# Patient Record
Sex: Male | Born: 1960 | Race: White | Hispanic: No | Marital: Single | Smoking: Current some day smoker
Health system: Southern US, Community
[De-identification: ages and names within clinical notes are randomized; demographics above are authoritative.]

## PROBLEM LIST (undated history)

## (undated) DIAGNOSIS — J45909 Unspecified asthma, uncomplicated: Secondary | ICD-10-CM

## (undated) DIAGNOSIS — Z8739 Personal history of other diseases of the musculoskeletal system and connective tissue: Secondary | ICD-10-CM

## (undated) DIAGNOSIS — F1120 Opioid dependence, uncomplicated: Secondary | ICD-10-CM

## (undated) DIAGNOSIS — F209 Schizophrenia, unspecified: Secondary | ICD-10-CM

## (undated) DIAGNOSIS — F329 Major depressive disorder, single episode, unspecified: Secondary | ICD-10-CM

## (undated) DIAGNOSIS — F419 Anxiety disorder, unspecified: Secondary | ICD-10-CM

## (undated) DIAGNOSIS — M1990 Unspecified osteoarthritis, unspecified site: Secondary | ICD-10-CM

## (undated) DIAGNOSIS — J449 Chronic obstructive pulmonary disease, unspecified: Secondary | ICD-10-CM

## (undated) DIAGNOSIS — G8929 Other chronic pain: Secondary | ICD-10-CM

## (undated) DIAGNOSIS — J42 Unspecified chronic bronchitis: Secondary | ICD-10-CM

## (undated) DIAGNOSIS — F32A Depression, unspecified: Secondary | ICD-10-CM

## (undated) HISTORY — DX: Opioid dependence, uncomplicated: F11.20

## (undated) HISTORY — DX: Unspecified chronic bronchitis: J42

## (undated) HISTORY — DX: Anxiety disorder, unspecified: F41.9

## (undated) HISTORY — DX: Depression, unspecified: F32.A

## (undated) HISTORY — PX: BACK SURGERY: SHX140

## (undated) HISTORY — DX: Unspecified osteoarthritis, unspecified site: M19.90

## (undated) HISTORY — DX: Major depressive disorder, single episode, unspecified: F32.9

## (undated) HISTORY — DX: Unspecified asthma, uncomplicated: J45.909

## (undated) HISTORY — PX: OTHER SURGICAL HISTORY: SHX169

## (undated) HISTORY — DX: Other chronic pain: G89.29

## (undated) HISTORY — PX: SKIN GRAFT: SHX250

---
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
## 2008-05-30 ENCOUNTER — Emergency Department (HOSPITAL_COMMUNITY): Admission: EM | Admit: 2008-05-30 | Discharge: 2008-05-30 | Payer: Self-pay | Admitting: Emergency Medicine

## 2010-08-26 ENCOUNTER — Emergency Department (HOSPITAL_COMMUNITY)
Admission: EM | Admit: 2010-08-26 | Discharge: 2010-08-27 | Disposition: A | Discharge status: Home / Self Care | Payer: No Typology Code available for payment source | Attending: Emergency Medicine | Admitting: Emergency Medicine

## 2010-08-26 DIAGNOSIS — S335XXA Sprain of ligaments of lumbar spine, initial encounter: Secondary | ICD-10-CM | POA: Insufficient documentation

## 2010-08-26 DIAGNOSIS — M545 Low back pain, unspecified: Secondary | ICD-10-CM | POA: Insufficient documentation

## 2010-08-26 DIAGNOSIS — W19XXXA Unspecified fall, initial encounter: Secondary | ICD-10-CM | POA: Insufficient documentation

## 2010-08-27 ENCOUNTER — Emergency Department (HOSPITAL_COMMUNITY): Payer: Self-pay

## 2011-09-28 DIAGNOSIS — E778 Other disorders of glycoprotein metabolism: Secondary | ICD-10-CM | POA: Insufficient documentation

## 2011-09-28 DIAGNOSIS — R739 Hyperglycemia, unspecified: Secondary | ICD-10-CM | POA: Insufficient documentation

## 2011-09-29 DIAGNOSIS — R449 Unspecified symptoms and signs involving general sensations and perceptions: Secondary | ICD-10-CM | POA: Insufficient documentation

## 2012-08-19 DIAGNOSIS — M5412 Radiculopathy, cervical region: Secondary | ICD-10-CM | POA: Insufficient documentation

## 2012-08-19 DIAGNOSIS — G43009 Migraine without aura, not intractable, without status migrainosus: Secondary | ICD-10-CM | POA: Insufficient documentation

## 2014-06-21 DIAGNOSIS — F323 Major depressive disorder, single episode, severe with psychotic features: Secondary | ICD-10-CM | POA: Insufficient documentation

## 2014-06-22 DIAGNOSIS — F209 Schizophrenia, unspecified: Secondary | ICD-10-CM | POA: Insufficient documentation

## 2014-11-01 ENCOUNTER — Other Ambulatory Visit: Payer: Self-pay | Admitting: Orthopedic Surgery

## 2014-11-01 DIAGNOSIS — M25511 Pain in right shoulder: Secondary | ICD-10-CM

## 2014-11-23 ENCOUNTER — Other Ambulatory Visit: Payer: Self-pay

## 2014-11-23 ENCOUNTER — Inpatient Hospital Stay: Admission: RE | Admit: 2014-11-23 | Payer: Self-pay | Source: Ambulatory Visit

## 2016-05-28 DIAGNOSIS — K219 Gastro-esophageal reflux disease without esophagitis: Secondary | ICD-10-CM | POA: Diagnosis not present

## 2016-05-28 DIAGNOSIS — R001 Bradycardia, unspecified: Secondary | ICD-10-CM | POA: Diagnosis not present

## 2016-05-28 DIAGNOSIS — R55 Syncope and collapse: Secondary | ICD-10-CM | POA: Diagnosis not present

## 2016-05-28 DIAGNOSIS — R531 Weakness: Secondary | ICD-10-CM | POA: Diagnosis not present

## 2016-05-28 DIAGNOSIS — R079 Chest pain, unspecified: Secondary | ICD-10-CM | POA: Diagnosis not present

## 2016-05-29 DIAGNOSIS — R079 Chest pain, unspecified: Secondary | ICD-10-CM | POA: Diagnosis not present

## 2016-05-29 DIAGNOSIS — K219 Gastro-esophageal reflux disease without esophagitis: Secondary | ICD-10-CM | POA: Diagnosis not present

## 2016-05-29 DIAGNOSIS — R531 Weakness: Secondary | ICD-10-CM | POA: Diagnosis not present

## 2016-05-29 DIAGNOSIS — R001 Bradycardia, unspecified: Secondary | ICD-10-CM | POA: Diagnosis not present

## 2016-12-12 ENCOUNTER — Encounter: Payer: Self-pay | Admitting: Internal Medicine

## 2016-12-15 ENCOUNTER — Ambulatory Visit (INDEPENDENT_AMBULATORY_CARE_PROVIDER_SITE_OTHER)
Admission: RE | Admit: 2016-12-15 | Discharge: 2016-12-15 | Disposition: A | Discharge status: Home / Self Care | Payer: Medicaid Other | Source: Ambulatory Visit | Attending: Internal Medicine | Admitting: Internal Medicine

## 2016-12-15 ENCOUNTER — Encounter: Payer: Self-pay | Admitting: Internal Medicine

## 2016-12-15 ENCOUNTER — Ambulatory Visit (INDEPENDENT_AMBULATORY_CARE_PROVIDER_SITE_OTHER): Payer: Medicaid Other | Admitting: Internal Medicine

## 2016-12-15 VITALS — BP 132/78 | HR 61 | Ht 74.0 in | Wt 230.0 lb

## 2016-12-15 DIAGNOSIS — R0609 Other forms of dyspnea: Secondary | ICD-10-CM

## 2016-12-15 DIAGNOSIS — F17210 Nicotine dependence, cigarettes, uncomplicated: Secondary | ICD-10-CM

## 2016-12-15 DIAGNOSIS — J449 Chronic obstructive pulmonary disease, unspecified: Secondary | ICD-10-CM | POA: Diagnosis not present

## 2016-12-15 MED ORDER — BUDESONIDE-FORMOTEROL FUMARATE 160-4.5 MCG/ACT IN AERO: 2.0000 | INHALATION_SPRAY | Freq: Two times a day (BID) | RESPIRATORY_TRACT | 11 refills | Status: DC

## 2016-12-15 MED ORDER — BUDESONIDE-FORMOTEROL FUMARATE 160-4.5 MCG/ACT IN AERO
2.0000 | INHALATION_SPRAY | Freq: Two times a day (BID) | RESPIRATORY_TRACT | 0 refills | Status: DC
Start: 2016-12-15 — End: 2016-12-15

## 2016-12-15 MED ORDER — FAMOTIDINE 20 MG PO TABS: ORAL_TABLET | ORAL | 11 refills | Status: DC

## 2016-12-15 MED ORDER — PANTOPRAZOLE SODIUM 40 MG PO TBEC: 40.0000 mg | DELAYED_RELEASE_TABLET | Freq: Every day | ORAL | 2 refills | Status: DC

## 2016-12-15 NOTE — Progress Notes (Signed)
LMTCB

## 2016-12-15 NOTE — Patient Instructions (Addendum)
Plan A = Automatic = symbicort 160 Take 2 puffs first thing in am and then another 2 puffs about 12 hours later.    Work on inhaler technique:  relax and gently blow all the way out then take a nice smooth deep breath back in, triggering the inhaler at same time you start breathing in.  Hold for up to 5 seconds if you can. Blow out thru nose. Rinse and gargle with water when done   Plan B = Backup Only use your albuterol as a rescue medication to be used if you can't catch your breath by resting or doing a relaxed purse lip breathing pattern.  - The less you use it, the better it will work when you need it. - Ok to use the inhaler up to 2 puffs  every 4 hours if you must but call for appointment if use goes up over your usual need - Don't leave home without it !!  (think of it like the spare tire for your car)    Pantoprazole (protonix) 40 mg   Take  30-60 min before first meal of the day and Pepcid (famotidine)  20 mg one @  bedtime until return to office - this is the best way to tell whether stomach acid is contributing to your problem.    GERD (REFLUX)  is an extremely common cause of respiratory symptoms just like yours , many times with no obvious heartburn at all.    It can be treated with medication, but also with lifestyle changes including elevation of the head of your bed (ideally with 6 inch  bed blocks),  Smoking cessation, avoidance of late meals, excessive alcohol, and avoid fatty foods, chocolate, peppermint, colas, red wine, and acidic juices such as orange juice.  NO MINT OR MENTHOL PRODUCTS SO NO COUGH DROPS   USE SUGARLESS CANDY INSTEAD (Jolley ranchers or Stover's or Life Savers) or even ice chips will also do - the key is to swallow to prevent all throat clearing. NO OIL BASED VITAMINS - use powdered substitutes.   The key is to stop smoking completely before smoking completely stops you!   Although I don't endorse regular use of e cigs/ many patients find them helpful;  however, I emphasized they should be considered a "one-way bridge" off all tobacco products.   Please remember to go to the  x-ray department downstairs in the basement  for your tests - we will call you with the results when they are available.      Please schedule a follow up office visit in 6 weeks, call sooner if needed

## 2016-12-15 NOTE — Progress Notes (Signed)
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 Pulm Consult    Pt referred by Dr. Heide Guile for mixed restrictive and obstructive lung disease. Pt is having trouble with SOB while sittting and with exertion for last month. Pt having tightness in chest, having some dry cough throughtout the day.   using saba multiple times during the day  including 2.5 h  prior to OV  And waking up at noct at least once  Wakes up nl hour / gasping for air but s excess/ purulent sputum or mucus plugs   Doe x "anything" >> wife gets the mail and he's become extremely sedentary due to doe and back pain  No obvious day to day or daytime variability or assoc excess/ purulent sputum or mucus plugs or hemoptysis or cp or chest tightness, subjective wheeze or overt sinus or hb symptoms. No unusual exp hx or h/o childhood pna/ asthma or knowledge of premature birth.  Sleeping ok without nocturnal  or early am exacerbation  of respiratory  c/o's or need for noct saba. Also denies any obvious fluctuation of symptoms with weather or environmental changes or other aggravating or alleviating factors except as outlined above   Current Medications, Allergies, Complete Past Medical History, Past Surgical History, Family History, and Social History were reviewed in [employer] record.  ROS  The following are not  active complaints unless bolded sore throat, dysphagia, dental problems, itching, sneezing,  nasal congestion or excess/ purulent secretions, ear ache,   fever, chills, sweats, unintended wt loss, classically pleuritic or exertional cp,  orthopnea pnd or leg swelling, presyncope, palpitations, abdominal pain, anorexia, nausea, vomiting, diarrhea  or change in bowel or bladder habits, change in stools or urine, dysuria,hematuria,  rash, arthralgias, visual complaints, headache, numbness, weakness or ataxia or problems with walking or coordination,  change in mood/affect or memory.             Review of Systems     Objective:   Physical Exam    amb disheveled wm nad >> stated age    Wt Readings from Last 3 Encounters:  12/15/16 230 lb (104.3 kg)    Vital signs reviewed  - Note on arrival 02 sats  98% on RA      HEENT: nl  turbinates bilaterally, and oropharynx. Nl external ear canals without cough reflex - edentulous    NECK :  without JVD/Nodes/TM/ nl carotid upstrokes bilaterally   LUNGS: no acc muscle use,   slt barrel chest with prominent pseudowheeze better with plm    CV:  RRR  no s3 or murmur or increase in P2, and no edema   ABD:  soft and nontender with nl inspiratory excursion in the supine position. No bruits or organomegaly appreciated, bowel  sounds nl  MS:  Nl gait/ ext warm without deformities, calf tenderness, cyanosis or clubbing No obvious joint restrictions   SKIN: warm and dry without lesions    NEURO:  alert, approp, nl sensorium with  no motor or cerebellar deficits apparent.    CXR PA and Lateral:   12/15/2016 :    I personally reviewed images and agree with radiology impression as follows:    Chronic bronchitic changes without acute abnormalities.    Assessment:

## 2016-12-16 ENCOUNTER — Telehealth: Payer: Self-pay | Admitting: Internal Medicine

## 2016-12-16 DIAGNOSIS — F17210 Nicotine dependence, cigarettes, uncomplicated: Secondary | ICD-10-CM | POA: Insufficient documentation

## 2016-12-16 NOTE — Progress Notes (Signed)
LMTCB

## 2016-12-16 NOTE — Assessment & Plan Note (Addendum)
12/15/2016  Walked RA x 3 laps @ 185 ft each stopped due to  End of study, nl pace, no  desat - mild sob at end    Based on this study symptoms are not as bad as he described and do fit COPD GOLD 0 and nothing to suggest alternative dx at this point but clearly able to walk to [facility] to get the mail - strongly encouraged regular walking so we can monitor his response to rx

## 2016-12-16 NOTE — Assessment & Plan Note (Addendum)
PFT's  10/23/2016  FEV1 3.30 (79 % ) ratio 74  p no % improvement from saba p saba prior to study with DLCO  60 % corrects to 119  % for alv volume   - 12/15/2016  After extensive coaching HFA effectiveness =  50% > try symbicort 160 2bid    In this case Adherence is the biggest issue and starts with  inability to use HFA appropriately (way too much at baseline) / effectively and also  understand that SABA treats the symptoms but doesn't get to the underlying problem (inflammation).  I used  the analogy of putting steroid cream on a rash to help explain the meaning of topical therapy and the need to get the drug to the target tissue.   - see hfa teaching - return with all meds in hand using a trust but verify approach to confirm accurate Medication  Reconciliation The principal here is that until we are certain that the  patients are doing what we've asked, it makes no sense to ask them to do more.    Active smoking > see sep a/p   ? Allergy/ asthma component > symb 160 should do   ? Anxiety/depression/ deconditioning  > usually at the bottom of this list of usual suspects but should be much higher on this pt's based on H and P and note already on psychotropics .   ? Acid (or non-acid) GERD > always difficult to exclude as up to 75% of pts in some series report no assoc GI/ Heartburn symptoms - his am "gasping for air" could be due to noct LPR  > rec max (24h)  acid suppression and diet restrictions/ reviewed and instructions given in writing.    Total time devoted to counseling  > 50 % of initial 60 min office visit:  review case with pt/wife Tresa EndoKelly  discussion of options/alternatives/ personally creating written customized instructions  in presence of pt  then going over those specific  Instructions directly with the pt including how to use all of the meds but in particular covering each new medication in detail and the difference between the maintenance= "automatic" meds and the prns using an  action plan format for the latter (If this problem/symptom => do that organization reading Left to right).  Please see AVS from this visit for a full list of these instructions which I personally wrote for this pt and  are unique to this visit.

## 2016-12-16 NOTE — Telephone Encounter (Signed)
Notes recorded by Nyoka CowdenWert, Michael B, MD on 12/15/2016 at 2:06 PM EDT Call pt: Reviewed cxr and no acute change so no change in recommendations made at [facility] ------------------------- Spoke with pt. He is aware of results. Nothing further was needed.

## 2016-12-16 NOTE — Assessment & Plan Note (Signed)

## 2016-12-18 NOTE — Progress Notes (Signed)
Spoke with the pt and he states already aware of results See 12/16/16 phone note

## 2017-01-26 ENCOUNTER — Ambulatory Visit: Payer: Medicaid Other | Admitting: Internal Medicine

## 2017-04-14 ENCOUNTER — Emergency Department (HOSPITAL_COMMUNITY): Payer: Medicaid Other

## 2017-04-14 ENCOUNTER — Encounter (HOSPITAL_COMMUNITY): Payer: Self-pay | Admitting: *Deleted

## 2017-04-14 ENCOUNTER — Emergency Department (HOSPITAL_COMMUNITY)
Admission: EM | Admit: 2017-04-14 | Discharge: 2017-04-14 | Disposition: A | Discharge status: Home / Self Care | Payer: Medicaid Other | Attending: Emergency Medicine | Admitting: Emergency Medicine

## 2017-04-14 DIAGNOSIS — W19XXXA Unspecified fall, initial encounter: Secondary | ICD-10-CM

## 2017-04-14 DIAGNOSIS — F17210 Nicotine dependence, cigarettes, uncomplicated: Secondary | ICD-10-CM | POA: Diagnosis not present

## 2017-04-14 DIAGNOSIS — R202 Paresthesia of skin: Secondary | ICD-10-CM | POA: Diagnosis not present

## 2017-04-14 DIAGNOSIS — S0990XA Unspecified injury of head, initial encounter: Secondary | ICD-10-CM | POA: Diagnosis present

## 2017-04-14 DIAGNOSIS — M25522 Pain in left elbow: Secondary | ICD-10-CM | POA: Diagnosis not present

## 2017-04-14 DIAGNOSIS — Z79899 Other long term (current) drug therapy: Secondary | ICD-10-CM | POA: Diagnosis not present

## 2017-04-14 DIAGNOSIS — M5432 Sciatica, left side: Secondary | ICD-10-CM | POA: Diagnosis not present

## 2017-04-14 DIAGNOSIS — W010XXA Fall on same level from slipping, tripping and stumbling without subsequent striking against object, initial encounter: Secondary | ICD-10-CM | POA: Diagnosis not present

## 2017-04-14 DIAGNOSIS — J45909 Unspecified asthma, uncomplicated: Secondary | ICD-10-CM | POA: Diagnosis not present

## 2017-04-14 DIAGNOSIS — Y999 Unspecified external cause status: Secondary | ICD-10-CM | POA: Diagnosis not present

## 2017-04-14 DIAGNOSIS — Y9389 Activity, other specified: Secondary | ICD-10-CM | POA: Insufficient documentation

## 2017-04-14 DIAGNOSIS — S0081XA Abrasion of other part of head, initial encounter: Secondary | ICD-10-CM | POA: Insufficient documentation

## 2017-04-14 DIAGNOSIS — S00219A Abrasion of unspecified eyelid and periocular area, initial encounter: Secondary | ICD-10-CM

## 2017-04-14 DIAGNOSIS — J449 Chronic obstructive pulmonary disease, unspecified: Secondary | ICD-10-CM | POA: Diagnosis not present

## 2017-04-14 DIAGNOSIS — Y92018 Other place in single-family (private) house as the place of occurrence of the external cause: Secondary | ICD-10-CM | POA: Insufficient documentation

## 2017-04-14 LAB — BASIC METABOLIC PANEL
ANION GAP: 9 (ref 5–15)
BUN: 21 mg/dL — ABNORMAL HIGH (ref 6–20)
CHLORIDE: 100 mmol/L — AB (ref 101–111)
CO2: 25 mmol/L (ref 22–32)
Calcium: 8.9 mg/dL (ref 8.9–10.3)
Creatinine, Ser: 1 mg/dL (ref 0.61–1.24)
GFR calc non Af Amer: >60 mL/min (ref >60)
Glucose, Bld: 84 mg/dL (ref 65–99)
Potassium: 3.6 mmol/L (ref 3.5–5.1)
Sodium: 134 mmol/L — ABNORMAL LOW (ref 135–145)

## 2017-04-14 LAB — URINALYSIS, ROUTINE W REFLEX MICROSCOPIC
Bilirubin Urine: NEGATIVE
GLUCOSE, UA: NEGATIVE mg/dL
Ketones, ur: NEGATIVE mg/dL
Leukocytes, UA: NEGATIVE
Nitrite: NEGATIVE
Protein, ur: NEGATIVE mg/dL
Specific Gravity, Urine: >1.03 — ABNORMAL HIGH (ref 1.005–1.030)
pH: 5.5 (ref 5.0–8.0)

## 2017-04-14 LAB — CBC
HCT: 44.3 % (ref 39.0–52.0)
HEMOGLOBIN: 14.9 g/dL (ref 13.0–17.0)
MCH: 30.2 pg (ref 26.0–34.0)
MCHC: 33.6 g/dL (ref 30.0–36.0)
MCV: 89.7 fL (ref 78.0–100.0)
Platelets: 234 10*3/uL (ref 150–400)
RBC: 4.94 MIL/uL (ref 4.22–5.81)
RDW: 12.7 % (ref 11.5–15.5)
WBC: 7.7 10*3/uL (ref 4.0–10.5)

## 2017-04-14 LAB — URINALYSIS, MICROSCOPIC (REFLEX)

## 2017-04-14 LAB — CBG MONITORING, ED: GLUCOSE-CAPILLARY: 102 mg/dL — AB (ref 65–99)

## 2017-04-14 MED ORDER — OXYCODONE-ACETAMINOPHEN 5-325 MG PO TABS: 2.0000 | ORAL_TABLET | Freq: Once | ORAL | Status: DC

## 2017-04-14 MED ORDER — CYCLOBENZAPRINE HCL 10 MG PO TABS
10.0000 mg | ORAL_TABLET | Freq: Once | ORAL | Status: AC
  Administered 2017-04-14: 10 mg via ORAL
  Filled 2017-04-14: qty 1

## 2017-04-14 MED ORDER — DICLOFENAC SODIUM 75 MG PO TBEC
75.0000 mg | DELAYED_RELEASE_TABLET | Freq: Two times a day (BID) | ORAL | 0 refills | Status: DC
Start: 2017-04-14 — End: 2019-08-12

## 2017-04-14 MED ORDER — DEXAMETHASONE SODIUM PHOSPHATE 10 MG/ML IJ SOLN
10.0000 mg | Freq: Once | INTRAMUSCULAR | Status: AC
  Administered 2017-04-14: 10 mg via INTRAMUSCULAR
  Filled 2017-04-14: qty 1

## 2017-04-14 MED ORDER — LIDOCAINE 5 % EX PTCH: 1.0000 | MEDICATED_PATCH | CUTANEOUS | Status: DC

## 2017-04-14 MED ORDER — LIDOCAINE 5 % EX PTCH: 1.0000 | MEDICATED_PATCH | CUTANEOUS | 0 refills | Status: DC

## 2017-04-14 MED ORDER — KETOROLAC TROMETHAMINE 30 MG/ML IJ SOLN: 30.0000 mg | Freq: Once | INTRAMUSCULAR | Status: DC

## 2017-04-14 MED ORDER — KETOROLAC TROMETHAMINE 30 MG/ML IJ SOLN
30.0000 mg | Freq: Once | INTRAMUSCULAR | Status: AC
  Administered 2017-04-14: 30 mg via INTRAMUSCULAR
  Filled 2017-04-14: qty 1

## 2017-04-14 MED ORDER — DEXAMETHASONE SODIUM PHOSPHATE 10 MG/ML IJ SOLN: 10.0000 mg | Freq: Once | INTRAMUSCULAR | Status: DC

## 2017-04-14 MED ORDER — METHOCARBAMOL 500 MG PO TABS: 500.0000 mg | ORAL_TABLET | Freq: Three times a day (TID) | ORAL | 0 refills | Status: DC | PRN

## 2017-04-14 NOTE — ED Notes (Signed)
Pt ambulatory, stable, and verbalizes understanding of d/c instructions. 

## 2017-04-14 NOTE — ED Notes (Signed)
ED Provider at bedside. 

## 2017-04-14 NOTE — ED Triage Notes (Addendum)
Per EMS- pt had a fall this morning while walking his dog and tripped. Pt not reports left elbow pain and weakness. Pt also has left leg pain with hx of sciatica. Pt also has minor laceration to left elbow. Pt states that he also woke with some dizziness this morning. Pt states that be is at his baseline for other weaknesses related to back injuries and shoulder injuries in the past.  No neuro deficits with EMS. VSS

## 2017-04-14 NOTE — ED Notes (Signed)
Pt family at the desk stating patient is having chest pain, RN went to bedside to assess and patient was sleeping, upon waking patient he stated he just didn't feel good but did not complain of pain. MD notified.

## 2017-04-14 NOTE — ED Notes (Signed)
Pt ambulatory to restroom
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
## 2017-04-14 NOTE — ED Provider Notes (Signed)
11:00 PM Patient care assumed from [facility], PA-C at change of shift. Patient reporting a mechanical fall at 9 AM while walking his dog. He is complaining of pain to his left upper extremity. 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  Patient has been sleeping for the majority of his time in the ED; appearing in no discomfort. He was previously given anti-inflammatories as well as a steroid injection. One course of pain medicine was added for pain management. I went to assess the patient and upon waking him he was noted to move all extremities spontaneously. He took his left hand and lifted it without prompting placing the palm of his hand behind his neck to stretch. When I attempted to passively move his left upper extremity, his movement was very limited as he was resisting my exam with 5/5 strength in all major muscle groups of the left arm.  A cervical spine CT was obtained which does not show any evidence of acute traumatic injury. Given full range of motion with normal strength, I do not believe further emergent workup or imaging is indicated. Patient to be discharged with a course of NSAIDs as well as Lidoderm patches. He has been instructed to follow-up with his orthopedist to which she verbalizes understanding. Return precautions discussed and provided. Patient discharged in stable condition with no unaddressed concerns.   Results for orders placed or performed during the hospital encounter of 04/14/17  Basic metabolic panel  Result Value Ref Range   Sodium 134 (L) 135 - 145 mmol/L   Potassium 3.6 3.5 - 5.1 mmol/L   Chloride 100 (L) 101 - 111 mmol/L   CO2 25 22 - 32 mmol/L   Glucose, Bld 84 65 - 99 mg/dL   BUN 21 (H) 6 - 20 mg/dL   Creatinine, Ser 1.61 0.61 - 1.24 mg/dL   Calcium 8.9 8.9 - 09.6 mg/dL   GFR calc non Af Amer >60 >60 mL/min   GFR calc Af Amer >60 >60 mL/min   Anion gap 9 5 - 15  CBC  Result Value  Ref Range   WBC 7.7 4.0 - 10.5 K/uL   RBC 4.94 4.22 - 5.81 MIL/uL   Hemoglobin 14.9 13.0 - 17.0 g/dL   HCT 04.5 40.9 - 81.1 %   MCV 89.7 78.0 - 100.0 fL   MCH 30.2 26.0 - 34.0 pg   MCHC 33.6 30.0 - 36.0 g/dL   RDW 91.4 78.2 - 95.6 %   Platelets 234 150 - 400 K/uL  Urinalysis, Routine w reflex microscopic  Result Value Ref Range   Color, Urine YELLOW YELLOW   APPearance HAZY (A) CLEAR   Specific Gravity, Urine >1.030 (H) 1.005 - 1.030   pH 5.5 5.0 - 8.0   Glucose, UA NEGATIVE NEGATIVE mg/dL   Hgb urine dipstick TRACE (A) NEGATIVE   Bilirubin Urine NEGATIVE NEGATIVE   Ketones, ur NEGATIVE NEGATIVE mg/dL   Protein, ur NEGATIVE NEGATIVE mg/dL   Nitrite NEGATIVE NEGATIVE   Leukocytes, UA NEGATIVE NEGATIVE  Urinalysis, Microscopic (reflex)  Result Value Ref Range   RBC / HPF 0-5 0 - 5 RBC/hpf   WBC, UA 0-5 0 - 5 WBC/hpf   Bacteria, UA FEW (A) NONE SEEN   Squamous Epithelial / LPF 0-5 (A) NONE SEEN   Mucus PRESENT    Hyaline Casts, UA PRESENT    Sperm, UA PRESENT    Urine-Other LESS THAN 10  mL OF URINE SUBMITTED   CBG monitoring, ED  Result Value Ref Range   Glucose-Capillary 102 (H) 65 - 99 mg/dL   Dg Elbow Complete Left  Result Date: 04/14/2017 CLINICAL DATA:  Larey SeatFell today.  Medial elbow pain. EXAM: LEFT ELBOW - COMPLETE 3+ VIEW COMPARISON:  None. FINDINGS: There is no evidence of fracture, dislocation, or joint effusion. There is no evidence of arthropathy or other focal bone abnormality. Soft tissues are unremarkable. IMPRESSION: Negative. Electronically Signed   By: Paulina FusiMark  Shogry M.D.   On: 04/14/2017 13:16   Ct Head Wo Contrast  Result Date: 04/14/2017 CLINICAL DATA:  Dizziness. Fell this morning. LEFT side is not working, according to the patient. EXAM: CT HEAD WITHOUT CONTRAST TECHNIQUE: Contiguous axial images were obtained from the base of the skull through the vertex without intravenous contrast. COMPARISON:  MRI brain 05/29/2016. FINDINGS: Brain: No evidence for acute  infarction, hemorrhage, mass lesion, hydrocephalus, or extra-axial fluid. Normal for age cerebral volume. Scattered areas of hypoattenuation throughout the white matter, probably unchanged in comparison with prior MR, nonspecific. Vascular: No hyperdense vessel or unexpected calcification. Skull: Normal. Negative for fracture or focal lesion. Sinuses/Orbits: Chronic maxillary and ethmoid sinus disease, without definite layering fluid. Other: Chronic anterior subluxation or dislocation of the LEFT TMJ. IMPRESSION: No acute intracranial findings are evident. Scattered white matter hypodensities, nonspecific, probably unchanged from prior MR. Sinusitis, without layering fluid. Electronically Signed   By: Elsie StainJohn T Curnes M.D.   On: 04/14/2017 13:39   Ct Cervical Spine Wo Contrast  Result Date: 04/14/2017 CLINICAL DATA:  Larey SeatFell while walking dog today.  Dizziness. EXAM: CT CERVICAL SPINE WITHOUT CONTRAST TECHNIQUE: Multidetector CT imaging of the cervical spine was performed without intravenous contrast. Multiplanar CT image reconstructions were also generated. COMPARISON:  MRI 05/29/2016 FINDINGS: Alignment: 3 mm degenerative anterolisthesis C4-5. Straightening of the normal cervical lordosis. Skull base and vertebrae: No fracture or traumatic finding. Soft tissues and spinal canal: No evidence of soft tissue injury or mass. Disc levels:  C1-2: Ordinary osteoarthritis.  No stenosis. C2-3: Normal interspace. C3-4: Mild uncovertebral prominence. Facet degeneration right more than left. No central canal stenosis. Mild bilateral bony foraminal narrowing. C4-5: Advanced degenerative facet arthropathy on the left. Anterolisthesis of 3 mm. Bulging of the disc. No central canal stenosis. Left foraminal narrowing that could affect the C5 nerve. C5-6: Chronic spondylosis with disc space narrowing. Mild bilateral bony foraminal narrowing. C6-7: Chronic spondylosis with disc space narrowing. Mild bilateral bony foraminal narrowing.  C6-7: Chronic spondylosis with disc space narrowing. Mild bilateral bony foraminal narrowing. Upper chest: Negative Other: None IMPRESSION: No acute or traumatic finding. Degenerative abnormalities in the cervical spine as outlined above. No compressive central canal stenosis. Foraminal narrowing as above. Electronically Signed   By: Paulina FusiMark  Shogry M.D.   On: 04/14/2017 20:20     Antony MaduraHumes, [place]adey Baz[place], PA-C 04/14/17 2319    Alvira MondaySchlossman, Erin, MD 04/15/17 [phone]

## 2019-08-01 ENCOUNTER — Ambulatory Visit: Payer: Self-pay | Admitting: Orthopedic Surgery

## 2019-08-23 ENCOUNTER — Ambulatory Visit: Payer: Self-pay | Admitting: Orthopedic Surgery

## 2019-08-23 NOTE — H&P (Signed)
Subjective:   Calvin Hartman is a pleasant 59 year old male with PMH significant for substance use disorder (on methadone), and tobacco use disorder who presents with chronic neck pain that has been worsening for the last year and has become severe and debilitating especially with range of motion over the last week. He denies any significant injury. He states due to the pain he has difficulty extending his neck and notes he has been looking down at the ground. Additionally he has increased pain when he has to extend his neck to put in his dentures. He has numbness and pain into his bilateral shoulders right greater than left. He also notes audible popping clicking with range of motion of his neck. Denies any normal arm weakness. Patient states he has taken some over-the-counter anti-inflammatories but these have not been significantly helpful. He has had a MRI and he would like to move forward with surgical intervention. He is scheduled for ACDF C5-7 08/31/19 at [facility] with Dr. Rolena Infante.  Patient Active Problem List   Diagnosis Date Noted  . Cigarette smoker 12/16/2016  . Dyspnea on exertion 12/15/2016  . COPD GOLD 0 / still smoking 12/15/2016  . Schizophrenia ([place]) 06/22/2014  . Major depressive disorder with psychotic features ([place]) 06/21/2014  . Migraine without aura 08/19/2012  . Brachial neuritis 08/19/2012  . Sensory deficit, left 09/29/2011  . Hypoproteinemia ([place]) 09/28/2011  . Hyperglycemia 09/28/2011  . Hypocalcemia 09/28/2011   Past Medical History:  Diagnosis Date  . Anxiety and depression   . Arthritis   . Asthma   . Bronchitis, chronic ([place])   . Chronic pain   . Opiate addiction [facility])     Past Surgical History:  Procedure Laterality Date  . NOSE SURGERY  1978    Current Outpatient Medications  Medication Sig Dispense Refill Last Dose  . albuterol (VENTOLIN HFA) 108 (90 Base) MCG/ACT inhaler Inhale 1-2 puffs into the lungs every 6 (six) hours as needed for wheezing or shortness of  breath.     . Aspirin-Acetaminophen-Caffeine (GOODY HEADACHE PO) Take 1 Package by mouth 2 (two) times daily as needed (pain).      . cephALEXin (KEFLEX) 500 MG capsule Take 500 mg by mouth 2 (two) times daily.     . methadone (DOLOPHINE) 10 MG/ML solution Take 103 mg by mouth daily.     Marland Kitchen oxyCODONE-acetaminophen (PERCOCET) 10-325 MG tablet Take 1 tablet by mouth 3 (three) times daily as needed for pain.     Marland Kitchen oxymetazoline (AFRIN) 0.05 % nasal spray Place 1 spray into both nostrils 2 (two) times daily as needed for congestion.      . rosuvastatin (CRESTOR) 5 MG tablet Take 5 mg by mouth every other day. At bedtime      No current facility-administered medications for this visit.   Allergies  Allergen Reactions  . Hydrocodone Nausea And Vomiting and Rash    Social History   Tobacco Use  . Smoking status: Current Some Day Smoker    Packs/day: 1.00    Years: 40.00    Pack years: 40.00  . Smokeless tobacco: Never Used  Substance Use Topics  . Alcohol use: No    Family History  Problem Relation Age of Onset  . Uterine cancer Mother   . Heart disease Father   . Heart attack Father   . Bone cancer Father   . Leukemia Father     Review of Systems As stated in HPI  Objective:   Vitals: Ht: 6 ft 1 in  08/22/2019 03:23 pm Wt: 216 lbs 08/22/2019 03:27 pm BMI: 28.5 08/22/2019 03:27 pm BP: 148/90 08/22/2019 03:30 pm Pulse: 83 bpm 08/22/2019 03:30 pm T: 97.8 F 08/22/2019 03:30 pm Pain Scale: 6 08/22/2019 03:27 pm  Clinical exam: Windell returns today for follow-up. He continues to have severe neck and radicular right arm pain. He is AAOx3, NAD.  Heart: RRR, no rubs, murmers, or gallops  Lungs: CTAB  Abdomen: BSx4, Non-tender; no repound tenderness, non-distended, no loss of bladder or bowel control.  Neurological exam: Positive right Spurling sign with reproduction of numbness and dysesthesias primarily in the C7 and C6 dermatome. Significant neck pain with palpation and  range of motion radiating into the right upper extremity. 5/5 motor strength bilaterally in the upper extremity except for some trace weakness of his right wrist extensor and tricep. Negative Hoffman test, no clonus, normal gait pattern.  No significant shoulder, elbow, wrist pain with isolated joint range of motion. 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 Surgical scar well-healed.  Cervical MRI: completed on 07/26/19 was reviewed with the patient. It was completed at [facility]; I have independently reviewed the images as well as the radiology report. No cord signal changes. Multilevel degenerative disease with reversal of normal cervical lordosis centered at C5-6. Advanced right facet arthrosis at C2-3 and C3-4. Moderate to severe right neuroforaminal narrowing with possible encroachment on the right C4 dorsal root ganglion. Advanced degenerative disc disease C5-T1. Mild to moderate neural foraminal narrowing right side C5-7.  Assessment:   Calvin Hartman is a pleasant 59 year old male with PMH significant for substance use disorder (on methadone), and tobacco use disorder who presents with chronic debilitating neck and neuropathic right arm pain for the last year. Prior to seeing me he was treated with formalized physical therapy and epidural cervical steroid injections. He states the injections only worsened his pain and he did not get any significant relief with physical therapy. At the time of his initial presentation he was documented having left hand weakness but he states this is secondary to the amputation of multiple fingers from a chain saw injury. He had a reimplantation and while he does have numbness in the left hand as well as weakness it is not his primary problem. He is very clear that the neck and radicular right arm pain primarily in the C6 and C7 dermatome is what is driving his loss in quality-of-life pain. He also describes occipital headaches and  numbness and headaches in the head.  Plan:   At this point time based on his imaging studies and clinical exam I believe his primary pain generator is the degenerative cervical disease at C5-6 and C6-7. While there is degenerative disease at C7-T1 he has no localizing C8 radicular pain. Although the MRI shows mild to moderate foraminal stenosis with minimal direct contact to the C6 and C7 nerve root clinically he is clearly having significant radicular pain in those dermatomes. I did indicate to him that his generalized headaches and the facial numbness is not related to the cervical spine. While his occipital headaches can be, I did inform him that the goal of surgery is to reduce not eliminate his neck and radicular arm pain and hopefully improve his quality-of-life. We have gone over the ACDF procedure in great detail and all of his questions were encouraged and addressed.  Risks and benefits of surgery were discussed with the patient. These include: Infection, bleeding, death, stroke, paralysis, ongoing or worse pain, need for additional  surgery, nonunion, leak of spinal fluid, adjacent segment degeneration requiring additional fusion surgery. Pseudoarthrosis (nonunion)requiring supplemental posterior fixation. Throat pain, swallowing difficulties, hoarseness or change in voice.  We have obtained preoperative medical clearance from the patient's primary care provider. Primary care provider informed us that the patient is on methadone chronically. I have discussed this with the patient as it will make his postoperative pain more difficult to manage. Additionally, I have personally reached out to his methadone clinic 3 times to discuss with the provider recommendations for managing his pain postoperatively, but no one has responded to return my call. At today's H&P I encouraged the patient that he should also reach out to his methadone provider to find out the best way to manage his pain postoperatively.  He did express an understanding of this.  I reviewed the patient's medication list with him. He is not on any blood thinners. Does not take any aspirin. He is not taking any over-the-counter anti-inflammatory medications and I advised him to avoid any over-the-counter anti-inflammatory medications leading up to surgery. He did express understanding of this.  We have also discussed the post-operative recovery period to include: bathing/showering restrictions, wound healing, activity (and driving) restrictions, medications/pain mangement.  We have also discussed post-operative redflags to include: signs and symptoms of postoperative infection, DVT/PE.  Patient states he has not heard from [facility] to schedule his preoperative testing. I informed him that they should be contacting him in and that it is very important he have his preoperative testing done with them.  Patient will get his Aspen collar at the hospital.  All patients questions were invited and answered  Follow-up: 2 weeks postoperatively

## 2019-08-23 NOTE — Pre-Procedure Instructions (Signed)
Your procedure is scheduled on Wednesday, Aug 31, 2019 from 07:30 AM- 11:30 AM.  Report to [facility] Main Entrance "A" at 05:30 A.M., and check in at the Admitting office.  Call this number if you have problems the morning of surgery:  [phone]  Call [phone] if you have any questions prior to your surgery date Monday-Friday 8am-4pm.    Remember:  Do not eat or drink after midnight the night before your surgery.    Take these medicines the morning of surgery with A SIP OF WATER:  IF NEEDED: oxyCODONE-acetaminophen (PERCOCET) oxymetazoline (AFRIN) nasal spray albuterol (VENTOLIN HFA) inhaler Please bring all inhalers with you the day of surgery.    As of today, STOP taking any Aspirin (unless otherwise instructed by your surgeon) and Aspirin containing products, Aleve, Naproxen, Ibuprofen, Motrin, Advil, Goody's, BC's, all herbal medications, fish oil, and all vitamins.                      Do not wear jewelry.            Do not wear lotions, powders, colognes, or deodorant.            Men may shave face and neck.            Do not bring valuables to the hospital.            [facility] is not responsible for any belongings or valuables.  Do NOT Smoke (Tobacco/Vapping) or drink Alcohol 24 hours prior to your procedure. If you use a CPAP at night, you may bring all equipment for your overnight stay.   Contacts, glasses, dentures or bridgework may not be worn into surgery.      For patients admitted to the hospital, discharge time will be determined by your treatment team.   Patients discharged the day of surgery will not be allowed to drive home, and someone needs to stay with them for 24 hours.    Special instructions:   [place]- Preparing For Surgery  Before surgery, you can play an important role. Because skin is not sterile, your skin needs to be as free of germs as possible. You can reduce the number of germs on your skin by washing with CHG  (chlorahexidine gluconate) Soap before surgery.  CHG is an antiseptic cleaner which kills germs and bonds with the skin to continue killing germs even after washing.    Oral Hygiene is also important to reduce your risk of infection.  Remember - BRUSH YOUR TEETH THE MORNING OF SURGERY WITH YOUR REGULAR TOOTHPASTE  Please do not use if you have an allergy to CHG or antibacterial soaps. If your skin becomes reddened/irritated stop using the CHG.  Do not shave (including legs and underarms) for at least 48 hours prior to first CHG shower. It is OK to shave your face.  Please follow these instructions carefully.   1. Shower the NIGHT BEFORE SURGERY and the MORNING OF SURGERY with CHG Soap.   2. If you chose to wash your hair, wash your hair first as usual with your normal shampoo.  3. After you shampoo, rinse your hair and body thoroughly to remove the shampoo.  4. Use CHG as you would any other liquid soap. You can apply CHG directly to the skin and wash gently with a scrungie or a clean washcloth.   5. Apply the CHG Soap to your body ONLY FROM THE NECK DOWN.  Do not use  on open wounds or open sores. Avoid contact with your eyes, ears, mouth and genitals (private parts). Wash Face and genitals (private parts)  with your normal soap.   6. Wash thoroughly, paying special attention to the area where your surgery will be performed.  7. Thoroughly rinse your body with warm water from the neck down.  8. DO NOT shower/wash with your normal soap after using and rinsing off the CHG Soap.  9. Pat yourself dry with a CLEAN TOWEL.  10. Wear CLEAN PAJAMAS to bed the night before surgery, wear comfortable clothes the morning of surgery  11. Place CLEAN SHEETS on your bed the night of your first shower and DO NOT SLEEP WITH PETS.   Day of Surgery:   Do not apply any deodorants/lotions.  Please wear clean clothes to the hospital/surgery center.   Remember to brush your teeth WITH YOUR REGULAR  TOOTHPASTE.   Please read over the following fact sheets that you were given.

## 2019-08-24 ENCOUNTER — Inpatient Hospital Stay (HOSPITAL_COMMUNITY)
Admission: RE | Admit: 2019-08-24 | Discharge: 2019-08-24 | Disposition: A | Discharge status: Home / Self Care | Payer: Medicaid Other | Source: Ambulatory Visit

## 2019-08-24 NOTE — Progress Notes (Addendum)
Patient did not show up for PAT appointment today. Multiple attempts made to get in contact with patient to re-schedule PAT appointment but to no avail. No answer. Lori at Dr. Shon Baton office made aware.

## 2019-08-24 NOTE — Progress Notes (Signed)
Patient returned phone call. Patient given new date and time for pre-op appointment on Monday 08/29/2019 at 8AM. Patient verbalized understanding.

## 2019-08-29 ENCOUNTER — Other Ambulatory Visit (HOSPITAL_COMMUNITY)
Admission: RE | Admit: 2019-08-29 | Discharge: 2019-08-29 | Disposition: A | Discharge status: Home / Self Care | Payer: Medicaid Other | Source: Ambulatory Visit | Attending: Orthopedic Surgery | Admitting: Orthopedic Surgery

## 2019-08-29 ENCOUNTER — Other Ambulatory Visit: Payer: Self-pay

## 2019-08-29 ENCOUNTER — Encounter (HOSPITAL_COMMUNITY)
Admission: RE | Admit: 2019-08-29 | Discharge: 2019-08-29 | Disposition: A | Discharge status: Home / Self Care | Payer: Medicaid Other | Source: Ambulatory Visit | Attending: Orthopedic Surgery | Admitting: Orthopedic Surgery

## 2019-08-29 ENCOUNTER — Encounter (HOSPITAL_COMMUNITY): Payer: Self-pay

## 2019-08-29 ENCOUNTER — Ambulatory Visit (HOSPITAL_COMMUNITY)
Admission: RE | Admit: 2019-08-29 | Discharge: 2019-08-29 | Disposition: A | Discharge status: Home / Self Care | Payer: Medicaid Other | Source: Ambulatory Visit | Attending: Orthopedic Surgery | Admitting: Orthopedic Surgery

## 2019-08-29 DIAGNOSIS — Z20822 Contact with and (suspected) exposure to covid-19: Secondary | ICD-10-CM | POA: Insufficient documentation

## 2019-08-29 DIAGNOSIS — Z01818 Encounter for other preprocedural examination: Secondary | ICD-10-CM

## 2019-08-29 DIAGNOSIS — Z01812 Encounter for preprocedural laboratory examination: Secondary | ICD-10-CM | POA: Diagnosis not present

## 2019-08-29 DIAGNOSIS — Z87891 Personal history of nicotine dependence: Secondary | ICD-10-CM | POA: Insufficient documentation

## 2019-08-29 HISTORY — DX: Chronic obstructive pulmonary disease, unspecified: J44.9

## 2019-08-29 HISTORY — DX: Schizophrenia, unspecified: F20.9

## 2019-08-29 HISTORY — DX: Personal history of other diseases of the musculoskeletal system and connective tissue: Z87.39

## 2019-08-29 LAB — CBC
HCT: 40 % (ref 39.0–52.0)
Hemoglobin: 13.2 g/dL (ref 13.0–17.0)
MCH: 31.5 pg (ref 26.0–34.0)
MCHC: 33 g/dL (ref 30.0–36.0)
MCV: 95.5 fL (ref 80.0–100.0)
Platelets: 238 10*3/uL (ref 150–400)
RBC: 4.19 MIL/uL — ABNORMAL LOW (ref 4.22–5.81)
RDW: 12.7 % (ref 11.5–15.5)
WBC: 7.9 10*3/uL (ref 4.0–10.5)
nRBC: 0 % (ref 0.0–0.2)

## 2019-08-29 LAB — BASIC METABOLIC PANEL
Anion gap: 7 (ref 5–15)
BUN: 19 mg/dL (ref 6–20)
CO2: 25 mmol/L (ref 22–32)
Calcium: 8.9 mg/dL (ref 8.9–10.3)
Chloride: 104 mmol/L (ref 98–111)
Creatinine, Ser: 0.82 mg/dL (ref 0.61–1.24)
GFR calc Af Amer: >60 mL/min (ref >60)
GFR calc non Af Amer: >60 mL/min (ref >60)
Glucose, Bld: 125 mg/dL — ABNORMAL HIGH (ref 70–99)
Potassium: 4 mmol/L (ref 3.5–5.1)
Sodium: 136 mmol/L (ref 135–145)

## 2019-08-29 LAB — URINALYSIS, ROUTINE W REFLEX MICROSCOPIC
Bilirubin Urine: NEGATIVE
Glucose, UA: NEGATIVE mg/dL
Hgb urine dipstick: NEGATIVE
Ketones, ur: NEGATIVE mg/dL
Leukocytes,Ua: NEGATIVE
Nitrite: NEGATIVE
Protein, ur: NEGATIVE mg/dL
Specific Gravity, Urine: 1.021 (ref 1.005–1.030)
pH: 5 (ref 5.0–8.0)

## 2019-08-29 LAB — APTT: aPTT: 29 seconds (ref 24–36)

## 2019-08-29 LAB — PROTIME-INR
INR: 1 (ref 0.8–1.2)
Prothrombin Time: 12.5 seconds (ref 11.4–15.2)

## 2019-08-29 LAB — SARS CORONAVIRUS 2 (TAT 6-24 HRS): SARS Coronavirus 2: NEGATIVE

## 2019-08-29 LAB — SURGICAL PCR SCREEN
MRSA, PCR: NEGATIVE
Staphylococcus aureus: POSITIVE — AB

## 2019-08-29 NOTE — Progress Notes (Signed)
PCP - Heide Guile- NP Cardiologist - denies seeing cardio since 2017-[facility]   Chest x-ray - 08/29/19 EKG - N/A Stress Test - 10/16/15 ECHO - 10/16/15 Cardiac Cath - Denies  Sleep Study - denies  Aspirin Instructions: Patient instructed to hold all Aspirin, NSAID's, herbal medications, fish oil and vitamins 7 days prior to surgery.    COVID TEST- 08/29/19 at [facility]. Pt instructed to remain in their car. Educated on Haematologist until [employer]    Anesthesia review: MD order  Patient denies shortness of breath, fever, cough and chest pain at PAT appointment   All instructions explained to the patient, with a verbal understanding of the material. Patient agrees to go over the instructions while at home for a better understanding. Patient also instructed to self quarantine after being tested for COVID-19. The opportunity to ask questions was provided.

## 2019-08-30 NOTE — Progress Notes (Signed)
Anesthesia Chart Review:  Pt was seen by cardiologist Dr. Tereso Newcomer at [facility] June 2017 for eval of presyncopal episode. Event monitor, echo, and stress test were ordered. The only results available in care everywhere are the event monitor which was benign. UNCRP subsequently was acquired by [facility] and records are no longer available directly through the office and must be requested from a separate [facility] medical records location. Records requested x3 via fax and phone with no response. Pt denied SOB and CP at PAT appointment. Attempted to call pt multiple times to clarify results of cardiac testing with no response.   Medical clearance from PCP dated 08/08/19, copy on pt chart.   History of substance use disorder, pt on methadone 103 mg/day.  Reviewed case with Dr. Bradley Ferris due to no receipt of previous cardiac records. He advised that given the pt has no documented history of cardiovascular disease, okay to proceed with DOS eval by assigned anesthesiologist.   Preop labs reviewed, unremarkable  Event monitor 11/14/15 (care everywhere): CONCLUSIONS: 1. Normal Event monitor.  2. No arrhythmia as described 3. Symptoms were not reported  4. Symptoms were not correlated with arrhythmias   Calvin Hartman [facility] Short Stay Center/Anesthesiology Phone [phone] 08/30/2019 3:20 PM

## 2019-08-30 NOTE — Anesthesia Preprocedure Evaluation (Addendum)
Anesthesia Evaluation  Patient identified by MRN, date of birth, ID band Patient awake    Reviewed: Allergy & Precautions, H&P , NPO status , Patient's Chart, lab work & pertinent test results  Airway Mallampati: II  TM Distance: >3 FB Neck ROM: Full    Dental no notable dental hx. (+) Edentulous Upper, Edentulous Lower, Dental Advisory Given   Pulmonary asthma , COPD, Current Smoker,    Pulmonary exam normal breath sounds clear to auscultation       Cardiovascular Exercise Tolerance: Good negative cardio ROS   Rhythm:Regular Rate:Normal     Neuro/Psych  Headaches, Anxiety Depression Schizophrenia    GI/Hepatic negative GI ROS, Neg liver ROS,   Endo/Other  negative endocrine ROS  Renal/GU negative Renal ROS  negative genitourinary   Musculoskeletal  (+) Arthritis , Osteoarthritis,    Abdominal   Peds  Hematology negative hematology ROS (+)   Anesthesia Other Findings   Reproductive/Obstetrics negative OB ROS                           Anesthesia Physical Anesthesia Plan  ASA: II  Anesthesia Plan: General   Post-op Pain Management:    Induction: Intravenous  PONV Risk Score and Plan: 2 and Ondansetron, Dexamethasone and Midazolam  Airway Management Planned: Oral ETT  Additional Equipment:   Intra-op Plan:   Post-operative Plan: Extubation in OR  Informed Consent: I have reviewed the patients History and Physical, chart, labs and discussed the procedure including the risks, benefits and alternatives for the proposed anesthesia with the patient or authorized representative who has indicated his/her understanding and acceptance.     Dental advisory given  Plan Discussed with: CRNA  Anesthesia Plan Comments: (See PAT note by Antionette Poles, PA-C )      Anesthesia Quick Evaluation

## 2019-08-31 ENCOUNTER — Observation Stay (HOSPITAL_COMMUNITY)
Admission: RE | Admit: 2019-08-31 | Discharge: 2019-09-01 | Disposition: A | Discharge status: Home / Self Care | Payer: Medicaid Other | Attending: Orthopedic Surgery | Admitting: Orthopedic Surgery

## 2019-08-31 ENCOUNTER — Ambulatory Visit (HOSPITAL_COMMUNITY)
Admission: RE | Disposition: A | Discharge status: Home / Self Care | Payer: Self-pay | Source: Home / Self Care | Attending: Orthopedic Surgery

## 2019-08-31 ENCOUNTER — Other Ambulatory Visit: Payer: Self-pay

## 2019-08-31 ENCOUNTER — Ambulatory Visit (HOSPITAL_COMMUNITY): Payer: Medicaid Other | Admitting: Physician Assistant

## 2019-08-31 ENCOUNTER — Ambulatory Visit (HOSPITAL_COMMUNITY): Payer: Medicaid Other | Admitting: Anesthesiology

## 2019-08-31 ENCOUNTER — Encounter (HOSPITAL_COMMUNITY): Payer: Self-pay | Admitting: Orthopedic Surgery

## 2019-08-31 ENCOUNTER — Ambulatory Visit (HOSPITAL_COMMUNITY): Payer: Medicaid Other

## 2019-08-31 DIAGNOSIS — Z419 Encounter for procedure for purposes other than remedying health state, unspecified: Secondary | ICD-10-CM

## 2019-08-31 DIAGNOSIS — Z885 Allergy status to narcotic agent status: Secondary | ICD-10-CM | POA: Insufficient documentation

## 2019-08-31 DIAGNOSIS — M5412 Radiculopathy, cervical region: Secondary | ICD-10-CM | POA: Diagnosis present

## 2019-08-31 DIAGNOSIS — M1990 Unspecified osteoarthritis, unspecified site: Secondary | ICD-10-CM | POA: Insufficient documentation

## 2019-08-31 DIAGNOSIS — J449 Chronic obstructive pulmonary disease, unspecified: Secondary | ICD-10-CM | POA: Insufficient documentation

## 2019-08-31 HISTORY — PX: ANTERIOR CERVICAL DECOMP/DISCECTOMY FUSION: SHX1161

## 2019-08-31 SURGERY — ANTERIOR CERVICAL DECOMPRESSION/DISCECTOMY FUSION 2 LEVELS
Anesthesia: General | Site: Spine Cervical

## 2019-08-31 MED ORDER — EPINEPHRINE PF 1 MG/ML IJ SOLN
INTRAMUSCULAR | Status: AC
  Filled 2019-08-31: qty 1

## 2019-08-31 MED ORDER — THROMBIN (RECOMBINANT) 20000 UNITS EX SOLR
CUTANEOUS | Status: AC
  Filled 2019-08-31: qty 20000

## 2019-08-31 MED ORDER — LACTATED RINGERS IV SOLN: INTRAVENOUS | Status: DC | PRN

## 2019-08-31 MED ORDER — CHLORHEXIDINE GLUCONATE 0.12 % MT SOLN: 15.0000 mL | Freq: Once | OROMUCOSAL | Status: AC

## 2019-08-31 MED ORDER — PHENYLEPHRINE 40 MCG/ML (10ML) SYRINGE FOR IV PUSH (FOR BLOOD PRESSURE SUPPORT)
PREFILLED_SYRINGE | INTRAVENOUS | Status: AC
  Filled 2019-08-31: qty 10

## 2019-08-31 MED ORDER — ACETAMINOPHEN 650 MG RE SUPP: 650.0000 mg | RECTAL | Status: DC | PRN

## 2019-08-31 MED ORDER — METHOCARBAMOL 500 MG PO TABS: 500.0000 mg | ORAL_TABLET | Freq: Three times a day (TID) | ORAL | 0 refills | Status: AC | PRN

## 2019-08-31 MED ORDER — HYDROMORPHONE HCL 1 MG/ML IJ SOLN
0.2500 mg | INTRAMUSCULAR | Status: DC | PRN
  Administered 2019-08-31 (×2): 0.5 mg via INTRAVENOUS

## 2019-08-31 MED ORDER — 0.9 % SODIUM CHLORIDE (POUR BTL) OPTIME
TOPICAL | Status: DC | PRN
  Administered 2019-08-31 (×3): 1000 mL

## 2019-08-31 MED ORDER — OXYCODONE HCL 5 MG PO TABS
10.0000 mg | ORAL_TABLET | ORAL | Status: DC | PRN
  Administered 2019-08-31 – 2019-09-01 (×5): 10 mg via ORAL
  Filled 2019-08-31 (×5): qty 2

## 2019-08-31 MED ORDER — EPHEDRINE SULFATE-NACL 50-0.9 MG/10ML-% IV SOSY
PREFILLED_SYRINGE | INTRAVENOUS | Status: DC | PRN
  Administered 2019-08-31 (×5): 10 mg via INTRAVENOUS

## 2019-08-31 MED ORDER — SUGAMMADEX SODIUM 200 MG/2ML IV SOLN
INTRAVENOUS | Status: DC | PRN
  Administered 2019-08-31: 200 mg via INTRAVENOUS

## 2019-08-31 MED ORDER — BUPIVACAINE HCL (PF) 0.25 % IJ SOLN
INTRAMUSCULAR | Status: AC
  Filled 2019-08-31: qty 30

## 2019-08-31 MED ORDER — ONDANSETRON HCL 4 MG PO TABS: 4.0000 mg | ORAL_TABLET | Freq: Four times a day (QID) | ORAL | Status: DC | PRN

## 2019-08-31 MED ORDER — ONDANSETRON HCL 4 MG PO TABS: 4.0000 mg | ORAL_TABLET | Freq: Three times a day (TID) | ORAL | 0 refills | Status: AC | PRN

## 2019-08-31 MED ORDER — ONDANSETRON HCL 4 MG/2ML IJ SOLN
INTRAMUSCULAR | Status: DC | PRN
  Administered 2019-08-31: 4 mg via INTRAVENOUS

## 2019-08-31 MED ORDER — ACETAMINOPHEN 10 MG/ML IV SOLN
INTRAVENOUS | Status: DC | PRN
Start: 2019-08-31 — End: 2019-08-31
  Administered 2019-08-31: 1000 mg via INTRAVENOUS

## 2019-08-31 MED ORDER — LIDOCAINE 2% (20 MG/ML) 5 ML SYRINGE
INTRAMUSCULAR | Status: AC
  Filled 2019-08-31: qty 5

## 2019-08-31 MED ORDER — ROSUVASTATIN CALCIUM 5 MG PO TABS
5.0000 mg | ORAL_TABLET | ORAL | Status: DC
  Administered 2019-08-31: 5 mg via ORAL
  Filled 2019-08-31: qty 1

## 2019-08-31 MED ORDER — EPINEPHRINE PF 1 MG/ML IJ SOLN
INTRAMUSCULAR | Status: DC | PRN
  Administered 2019-08-31: .15 mL

## 2019-08-31 MED ORDER — METHOCARBAMOL 1000 MG/10ML IJ SOLN
500.0000 mg | Freq: Four times a day (QID) | INTRAVENOUS | Status: DC | PRN
  Filled 2019-08-31: qty 5

## 2019-08-31 MED ORDER — DEXMEDETOMIDINE HCL 200 MCG/2ML IV SOLN
INTRAVENOUS | Status: DC | PRN
  Administered 2019-08-31: 20 ug via INTRAVENOUS

## 2019-08-31 MED ORDER — BUPIVACAINE HCL (PF) 0.25 % IJ SOLN
INTRAMUSCULAR | Status: DC | PRN
  Administered 2019-08-31: 10 mL

## 2019-08-31 MED ORDER — KETAMINE HCL 50 MG/ML IJ SOLN
INTRAMUSCULAR | Status: DC | PRN
  Administered 2019-08-31: 40 mg via INTRAMUSCULAR

## 2019-08-31 MED ORDER — CEFAZOLIN SODIUM-DEXTROSE 2-4 GM/100ML-% IV SOLN
2.0000 g | Freq: Three times a day (TID) | INTRAVENOUS | Status: AC
  Administered 2019-08-31 (×2): 2 g via INTRAVENOUS
  Filled 2019-08-31 (×2): qty 100

## 2019-08-31 MED ORDER — LACTATED RINGERS IV SOLN: INTRAVENOUS | Status: DC

## 2019-08-31 MED ORDER — ACETAMINOPHEN 10 MG/ML IV SOLN
INTRAVENOUS | Status: AC
  Filled 2019-08-31: qty 100

## 2019-08-31 MED ORDER — ROCURONIUM BROMIDE 10 MG/ML (PF) SYRINGE
PREFILLED_SYRINGE | INTRAVENOUS | Status: AC
  Filled 2019-08-31: qty 10

## 2019-08-31 MED ORDER — ACETAMINOPHEN 325 MG PO TABS
650.0000 mg | ORAL_TABLET | ORAL | Status: DC | PRN
  Administered 2019-08-31: 650 mg via ORAL
  Filled 2019-08-31: qty 2

## 2019-08-31 MED ORDER — KETAMINE HCL 50 MG/5ML IJ SOSY
PREFILLED_SYRINGE | INTRAMUSCULAR | Status: AC
  Filled 2019-08-31: qty 5

## 2019-08-31 MED ORDER — ONDANSETRON HCL 4 MG/2ML IJ SOLN: 4.0000 mg | Freq: Four times a day (QID) | INTRAMUSCULAR | Status: DC | PRN

## 2019-08-31 MED ORDER — PROPOFOL 10 MG/ML IV BOLUS
INTRAVENOUS | Status: AC
  Filled 2019-08-31: qty 20

## 2019-08-31 MED ORDER — MIDAZOLAM HCL 2 MG/2ML IJ SOLN
INTRAMUSCULAR | Status: DC | PRN
  Administered 2019-08-31: 2 mg via INTRAVENOUS

## 2019-08-31 MED ORDER — SODIUM CHLORIDE 0.9% FLUSH: 3.0000 mL | INTRAVENOUS | Status: DC | PRN

## 2019-08-31 MED ORDER — PROPOFOL 10 MG/ML IV BOLUS
INTRAVENOUS | Status: DC | PRN
  Administered 2019-08-31: 130 mg via INTRAVENOUS

## 2019-08-31 MED ORDER — METHOCARBAMOL 500 MG PO TABS
500.0000 mg | ORAL_TABLET | Freq: Four times a day (QID) | ORAL | Status: DC | PRN
  Administered 2019-08-31 – 2019-09-01 (×3): 500 mg via ORAL
  Filled 2019-08-31 (×3): qty 1

## 2019-08-31 MED ORDER — FENTANYL CITRATE (PF) 250 MCG/5ML IJ SOLN
INTRAMUSCULAR | Status: DC | PRN
  Administered 2019-08-31: 50 ug via INTRAVENOUS
  Administered 2019-08-31 (×2): 100 ug via INTRAVENOUS

## 2019-08-31 MED ORDER — ALBUMIN HUMAN 5 % IV SOLN
INTRAVENOUS | Status: DC | PRN
Start: 2019-08-31 — End: 2019-08-31

## 2019-08-31 MED ORDER — FENTANYL CITRATE (PF) 250 MCG/5ML IJ SOLN
INTRAMUSCULAR | Status: AC
  Filled 2019-08-31: qty 5

## 2019-08-31 MED ORDER — GLYCOPYRROLATE 0.2 MG/ML IJ SOLN
INTRAMUSCULAR | Status: DC | PRN
Start: 2019-08-31 — End: 2019-08-31
  Administered 2019-08-31: .2 mg via INTRAVENOUS

## 2019-08-31 MED ORDER — CEFAZOLIN SODIUM-DEXTROSE 2-4 GM/100ML-% IV SOLN
2.0000 g | INTRAVENOUS | Status: AC
  Administered 2019-08-31: 2 g via INTRAVENOUS
  Filled 2019-08-31: qty 100

## 2019-08-31 MED ORDER — MIDAZOLAM HCL 2 MG/2ML IJ SOLN
INTRAMUSCULAR | Status: AC
  Filled 2019-08-31: qty 2

## 2019-08-31 MED ORDER — GLYCOPYRROLATE PF 0.2 MG/ML IJ SOSY
PREFILLED_SYRINGE | INTRAMUSCULAR | Status: AC
  Filled 2019-08-31: qty 1

## 2019-08-31 MED ORDER — HEMOSTATIC AGENTS (NO CHARGE) OPTIME
TOPICAL | Status: DC | PRN
  Administered 2019-08-31: 1

## 2019-08-31 MED ORDER — OXYCODONE-ACETAMINOPHEN 10-325 MG PO TABS: 1.0000 | ORAL_TABLET | Freq: Four times a day (QID) | ORAL | 0 refills | Status: AC | PRN

## 2019-08-31 MED ORDER — MENTHOL 3 MG MT LOZG: 1.0000 | LOZENGE | OROMUCOSAL | Status: DC | PRN

## 2019-08-31 MED ORDER — METHADONE HCL 10 MG PO TABS
105.0000 mg | ORAL_TABLET | Freq: Every day | ORAL | Status: DC
  Administered 2019-09-01: 105 mg via ORAL
  Filled 2019-08-31: qty 11

## 2019-08-31 MED ORDER — ALBUTEROL SULFATE (2.5 MG/3ML) 0.083% IN NEBU: 3.0000 mL | INHALATION_SOLUTION | Freq: Four times a day (QID) | RESPIRATORY_TRACT | Status: DC | PRN

## 2019-08-31 MED ORDER — SODIUM CHLORIDE 0.9% FLUSH
3.0000 mL | Freq: Two times a day (BID) | INTRAVENOUS | Status: DC
  Administered 2019-08-31: 3 mL via INTRAVENOUS

## 2019-08-31 MED ORDER — ORAL CARE MOUTH RINSE: 15.0000 mL | Freq: Once | OROMUCOSAL | Status: AC

## 2019-08-31 MED ORDER — EPHEDRINE 5 MG/ML INJ
INTRAVENOUS | Status: AC
  Filled 2019-08-31: qty 10

## 2019-08-31 MED ORDER — PHENOL 1.4 % MT LIQD: 1.0000 | OROMUCOSAL | Status: DC | PRN

## 2019-08-31 MED ORDER — DEXAMETHASONE SODIUM PHOSPHATE 10 MG/ML IJ SOLN
INTRAMUSCULAR | Status: DC | PRN
  Administered 2019-08-31: 10 mg via INTRAVENOUS

## 2019-08-31 MED ORDER — HYDROMORPHONE HCL 1 MG/ML IJ SOLN
INTRAMUSCULAR | Status: AC
  Filled 2019-08-31: qty 1

## 2019-08-31 MED ORDER — CHLORHEXIDINE GLUCONATE 0.12 % MT SOLN
OROMUCOSAL | Status: AC
  Administered 2019-08-31: 15 mL via OROMUCOSAL
  Filled 2019-08-31: qty 15

## 2019-08-31 MED ORDER — SUCCINYLCHOLINE CHLORIDE 200 MG/10ML IV SOSY
PREFILLED_SYRINGE | INTRAVENOUS | Status: AC
  Filled 2019-08-31: qty 10

## 2019-08-31 MED ORDER — ROCURONIUM BROMIDE 10 MG/ML (PF) SYRINGE
PREFILLED_SYRINGE | INTRAVENOUS | Status: DC | PRN
  Administered 2019-08-31: 20 mg via INTRAVENOUS
  Administered 2019-08-31: 60 mg via INTRAVENOUS
  Administered 2019-08-31: 20 mg via INTRAVENOUS

## 2019-08-31 MED ORDER — PROPOFOL 1000 MG/100ML IV EMUL
INTRAVENOUS | Status: AC
  Filled 2019-08-31: qty 100

## 2019-08-31 MED ORDER — LIDOCAINE 2% (20 MG/ML) 5 ML SYRINGE
INTRAMUSCULAR | Status: DC | PRN
  Administered 2019-08-31: 60 mg via INTRAVENOUS

## 2019-08-31 MED ORDER — DEXMEDETOMIDINE HCL IN NACL 200 MCG/50ML IV SOLN
INTRAVENOUS | Status: AC
  Filled 2019-08-31: qty 50

## 2019-08-31 MED ORDER — OXYCODONE HCL 5 MG PO TABS: 5.0000 mg | ORAL_TABLET | ORAL | Status: DC | PRN

## 2019-08-31 SURGICAL SUPPLY — 63 items
BIT DRILL NEURO 2X3.1 SFT TUCH (MISCELLANEOUS) IMPLANT
BONE VIVIGEN FORMABLE 1.3CC (Bone Implant) ×4 IMPLANT
CABLE BIPOLOR RESECTION CORD (MISCELLANEOUS) ×2 IMPLANT
CAGE CERV MOD 7X17X14 7D (Cage) ×4 IMPLANT
CANISTER SUCT 3000ML PPV (MISCELLANEOUS) ×2 IMPLANT
CLSR STERI-STRIP ANTIMIC 1/2X4 (GAUZE/BANDAGES/DRESSINGS) ×2 IMPLANT
COVER MAYO STAND STRL (DRAPES) ×4 IMPLANT
COVER SURGICAL LIGHT HANDLE (MISCELLANEOUS) ×2 IMPLANT
COVER WAND RF STERILE (DRAPES) ×2 IMPLANT
DRAPE C-ARM 42X72 X-RAY (DRAPES) ×2 IMPLANT
DRAPE POUCH INSTRU U-SHP 10X18 (DRAPES) ×2 IMPLANT
DRAPE SURG 17X23 STRL (DRAPES) ×2 IMPLANT
DRAPE U-SHAPE 47X51 STRL (DRAPES) ×2 IMPLANT
DRILL NEURO 2X3.1 SOFT TOUCH (MISCELLANEOUS)
DRSG OPSITE POSTOP 3X4 (GAUZE/BANDAGES/DRESSINGS) ×2 IMPLANT
DRSG OPSITE POSTOP 4X6 (GAUZE/BANDAGES/DRESSINGS) ×2 IMPLANT
DURAPREP 26ML APPLICATOR (WOUND CARE) ×2 IMPLANT
ELECT COATED BLADE 2.86 ST (ELECTRODE) ×2 IMPLANT
ELECT PENCIL ROCKER SW 15FT (MISCELLANEOUS) ×2 IMPLANT
ELECT REM PT RETURN 9FT ADLT (ELECTROSURGICAL) ×2
ELECTRODE REM PT RTRN 9FT ADLT (ELECTROSURGICAL) ×1 IMPLANT
GLOVE BIO SURGEON STRL SZ 6.5 (GLOVE) ×2 IMPLANT
GLOVE BIOGEL PI IND STRL 6.5 (GLOVE) ×1 IMPLANT
GLOVE BIOGEL PI IND STRL 8.5 (GLOVE) ×1 IMPLANT
GLOVE BIOGEL PI INDICATOR 6.5 (GLOVE) ×1
GLOVE BIOGEL PI INDICATOR 8.5 (GLOVE) ×1
GLOVE SS BIOGEL STRL SZ 8.5 (GLOVE) ×1 IMPLANT
GLOVE SUPERSENSE BIOGEL SZ 8.5 (GLOVE) ×1
GOWN STRL REUS W/ TWL LRG LVL3 (GOWN DISPOSABLE) ×1 IMPLANT
GOWN STRL REUS W/TWL 2XL LVL3 (GOWN DISPOSABLE) ×2 IMPLANT
GOWN STRL REUS W/TWL LRG LVL3 (GOWN DISPOSABLE) ×1
KIT BASIN OR (CUSTOM PROCEDURE TRAY) ×2 IMPLANT
KIT TURNOVER KIT B (KITS) ×2 IMPLANT
NEEDLE HYPO 22GX1.5 SAFETY (NEEDLE) ×2 IMPLANT
NEEDLE SPNL 18GX3.5 QUINCKE PK (NEEDLE) ×2 IMPLANT
NS IRRIG 1000ML POUR BTL (IV SOLUTION) ×4 IMPLANT
PACK ORTHO CERVICAL (CUSTOM PROCEDURE TRAY) ×2 IMPLANT
PACK UNIVERSAL I (CUSTOM PROCEDURE TRAY) ×2 IMPLANT
PAD ARMBOARD 7.5X6 YLW CONV (MISCELLANEOUS) ×4 IMPLANT
PATTIES SURGICAL .25X.25 (GAUZE/BANDAGES/DRESSINGS) ×2 IMPLANT
PIN DISTRACTION MAXCESS-C 14 ×4 IMPLANT
PLATE ACP 1.6X38 2LVL (Plate) ×2 IMPLANT
POSITIONER HEAD DONUT 9IN (MISCELLANEOUS) ×2 IMPLANT
RESTRAINT LIMB HOLDER UNIV (RESTRAINTS) ×2 IMPLANT
SCREW ACP 3.5X17 S/D VARIA (Screw) ×4 IMPLANT
SCREW ACP VA SD 3.5X15 (Screw) ×8 IMPLANT
SPONGE INTESTINAL PEANUT (DISPOSABLE) ×2 IMPLANT
SPONGE LAP 4X18 RFD (DISPOSABLE) ×4 IMPLANT
SPONGE SURGIFOAM ABS GEL 100 (HEMOSTASIS) ×2 IMPLANT
SURGIFLO W/THROMBIN 8M KIT (HEMOSTASIS) ×2 IMPLANT
SUT BONE WAX W31G (SUTURE) ×2 IMPLANT
SUT MNCRL AB 3-0 PS2 27 (SUTURE) ×2 IMPLANT
SUT SILK 2 0 (SUTURE)
SUT SILK 2-0 18XBRD TIE 12 (SUTURE) IMPLANT
SUT VIC AB 2-0 CT1 18 (SUTURE) ×2 IMPLANT
SYR BULB IRRIG 60ML STRL (SYRINGE) ×2 IMPLANT
SYR CONTROL 10ML LL (SYRINGE) ×2 IMPLANT
TAPE CLOTH 4X10 WHT NS (GAUZE/BANDAGES/DRESSINGS) ×2 IMPLANT
TAPE UMBILICAL COTTON 1/8X30 (MISCELLANEOUS) ×2 IMPLANT
THROMBIN 20,000 UNITS IMPLANT
TOWEL GREEN STERILE (TOWEL DISPOSABLE) ×2 IMPLANT
TOWEL GREEN STERILE FF (TOWEL DISPOSABLE) ×2 IMPLANT
WATER STERILE IRR 1000ML POUR (IV SOLUTION) ×2 IMPLANT

## 2019-08-31 NOTE — Anesthesia Procedure Notes (Signed)
Procedure Name: Intubation Date/Time: 08/31/2019 7:57 AM Performed by: Michele Rockers, CRNA Pre-anesthesia Checklist: Patient identified, Patient being monitored, Timeout performed, Emergency Drugs available and Suction available Patient Re-evaluated:Patient Re-evaluated prior to induction Oxygen Delivery Method: Circle System Utilized Preoxygenation: Pre-oxygenation with 100% oxygen Induction Type: IV induction Ventilation: Mask ventilation without difficulty and Oral airway inserted - appropriate to patient size Laryngoscope Size: Mac and 3 Grade View: Grade I Tube type: Oral Tube size: 8.0 mm Number of attempts: 1 Airway Equipment and Method: Stylet and Video-laryngoscopy Placement Confirmation: ETT inserted through vocal cords under direct vision,  positive ETCO2 and breath sounds checked- equal and bilateral Secured at: 23 cm Tube secured with: Tape Dental Injury: Teeth and Oropharynx as per pre-operative assessment

## 2019-08-31 NOTE — H&P (Signed)
Addendum H&P  Patient presents today because of ongoing significant cervical spondylitic radiculopathy. He has debilitating neck and radicular right arm pain. Clinical exam is consistent with cervical spondylitic radiculopathy in the C6 and C7 dermatome.  Despite appropriate conservative management consisting of physical therapy, injection therapy, activity modification, as well as medications his quality of life has failed to improve. Preoperative imaging studies demonstrated severe facet arthrosis and advanced degenerative disc disease and foraminal stenosis.  As a result of the failure of conservative management and clinical findings consistent with radiculopathy numbness dysesthesias and positive nerve root tension sign we've elected to move forward with a two-level ACDF. All appropriate risks benefits and alternatives to surgery were discussed with the patient and consent was obtained.

## 2019-08-31 NOTE — Op Note (Signed)
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  Right C6 and C7 radicular arm pain.  Postoperative diagnosis: Same  Operative procedure: Anterior cervical discectomy and fusion C5-7.  First Assistant: Glynis Smiles, PA  Allograft: vivogen  Complications: None  Implants: NuVasive ACP plate.  38 mm length.  Affixed with 17 mm length screws into the body of C5 and 15 mm screw into the bodies of C6 and C7.  Modulus cervical intervertebral cage 3D implant.  7 mm large lordotic spacer.  Placed at both C5-6 and C6-7.  Indications: This is a very pleasant 59 year old gentleman with significant neck and radicular arm pain.  Attempts at conservative management had failed to alleviate his pain.  As result of the ongoing debilitating nature of his pain we elected to move forward with surgery.  All appropriate risks benefits and alternatives to surgery were discussed and consent was obtained.  Operative note patient was brought the operating room placed upon the operating table.  After successful induction of general anesthesia and endotracheal ovation teds SCDs were applied and the arms were tucked at the side and the anterior cervical spine was prepped and draped in a standard fashion.  Timeout was taken to confirm patient procedure and all other important data.  X-ray was used to identify the midportion of the C6 vertebral body and a transverse incision site was marked out on the skin surface.  This was infiltrated with quarter percent Marcaine with epinephrine.  Transverse incision was then made starting at the midline and proceeding to the left.  Sharp dissection was carried out down to the platysma.  Platysma was isolated and incised.  I then performed a standard Smith-Robinson approach to the anterior cervical spine.  Sharp dissection was carried out along the medial border of the sternocleidomastoid into the deep cervical fascia.  The omohyoid muscle was isolated and  sacrificed for improved visual station I then continued sharply dissecting through the remainder of the deep cervical and prevertebral fascia keeping the trachea and esophagus retracted to the right and the carotid sheath retracted with a finger on the left side.  I then used Kitner dissectors to dissect through the remainder of the prevertebral fascia and expose the anterior longitudinal ligament from the inferior aspect of the C4-5 disc space to the superior aspect of the C7-T1 disc space.  A needle was placed into the C5-6 disc space and an x-ray was taken to confirm that it was at the appropriate level.  Once this was confirmed I then mobilized the longus coli muscle with bipolar electrocautery from the superior aspect of the C5 vertebral body to the inferior aspect of the C7 vertebral body.  Double-action Leksell rongeur was used to remove the anterior exostosis and expose the annulus.  Self-retaining Caspar retractor blades were placed underneath the longus coli muscle, and the endotracheal cuff was deflated.  I expanded the retractors to the appropriate width and then reinflated the endotracheal cuff.  An annulotomy at C6-7 was performed and pituitary rongeurs were used to remove all the disc material, and overhanging osteophytes from the inferior aspect of the C6 vertebral body.  Distraction pins were placed into the body of C6 and C7 and I gently distracted the intervertebral space and maintained it with the distraction pin set.  Using my nerve curettes I continue removing the disc material until I exposed the posterior annulus I then used a 1 mm Kerrison rongeur to dissect through the posterior annulus and resect the posterior osteophyte from  the vertebral bodies of 6 and 7.  I then used a fine nerve hook to develop a plane underneath the posterior longitudinal ligament and I resected this as well.  I could now get under the uncovertebral joint and decompress this area.  At this point I had removed all  of the disc material and osteophyte as well as remove the cartilaginous endplate to expose bleeding subchondral bone.  Using live fluoroscopy I confirmed had parallel endplate distraction and that my nerve hook could freely pass under the uncovertebral joint behind the vertebral bodies of C6 and C7 bilaterally.  I then used my trial intervertebral spacers and elected to use the size 7 large intervertebral spacer.  The cage was obtained and packed with appropriate allograft and then malleted to the appropriate depth.  I confirmed satisfactory positioning of the cage in depth with fluoroscopy.  At this point I repositioned my retractor blades to expose the C5-6 disc space.  Using the exact same technique I used at C6-7. I completed the discectomy at C5-6.  Again distraction pins were placed to allow better visualization and distraction of the disc space.  Posterior osteophyte from the inferior aspect of C5 was removed with the 1 mm Kerrison rongeur.  I could then get under the uncovertebral joint and complete the decompression.  Again I had parallel endplate distraction under live fluoroscopy and my nerve hook could freely pass underneath the uncovertebral joints bilaterally.  With the decompression complete I measured and placed the same size intervertebral spacer at this level.  Both cages were properly positioned well seated and allowed for excellent indirect decompression and restoration of normal cervical lordosis.  Distraction pins were removed and the bleeding edges were closed with bone wax.  A 38 mm length anterior cervical plate was then contoured and placed on the anterior cervical spine.  It was affixed to the body of C5 with a 17 mm locking screws, 15 mm locking screws into the bodies of C6 and C7.  All screws had excellent purchase.  The screws were then locked to the plate according manufacture standards.  The wound was then copiously irrigated with normal saline, and hemostasis was achieved using  bipolar cautery and FloSeal.  After final irrigation the trachea and esophagus were returned to midline and the platysma was closed with interrupted 2-0 Vicryl suture and the skin with 3-0 Monocryl.  Steri-Strips and a dry dressing were applied and the patient was ultimately extubated transfer the PACU without incident.  The end of the case all needle sponge counts were correct.

## 2019-08-31 NOTE — Discharge Instructions (Signed)

## 2019-08-31 NOTE — Anesthesia Postprocedure Evaluation (Signed)
Anesthesia Post Note  Patient: Calvin Hartman  Procedure(s) Performed: ANTERIOR CERVICAL DECOMPRESSION/DISCECTOMY FUSION CERVICAL FIVE- SEVEN (N/A Spine Cervical)     Patient location during evaluation: PACU Anesthesia Type: General Level of consciousness: awake and alert Pain management: pain level controlled Vital Signs Assessment: post-procedure vital signs reviewed and stable Respiratory status: spontaneous breathing, nonlabored ventilation and respiratory function stable Cardiovascular status: blood pressure returned to baseline and stable Postop Assessment: no apparent nausea or vomiting Anesthetic complications: no    Last Vitals:  Vitals:   08/31/19 1239 08/31/19 1245  BP: 119/67   Pulse: 82 81  Resp: 12 13  Temp:  36.7 C  SpO2: 94% 94%    Last Pain:  Vitals:   08/31/19 1230  TempSrc:   PainSc: Asleep                 Jeron Grahn,W. EDMOND

## 2019-08-31 NOTE — Brief Op Note (Signed)
08/31/2019  11:51 AM  PATIENT:  Calvin Hartman  59 y.o. male  PRE-OPERATIVE DIAGNOSIS:  Cervical spondylotic radiculopathy  POST-OPERATIVE DIAGNOSIS:  Cervical spondylotic radiculopathy  PROCEDURE:  Procedure(s) with comments: ANTERIOR CERVICAL DECOMPRESSION/DISCECTOMY FUSION CERVICAL FIVE- SEVEN (N/A) - 3.5 hrs  SURGEON:  Surgeon(s) and Role:    Venita Lick, MD - Primary  PHYSICIAN ASSISTANT:   ASSISTANTS: Amanda Ward, PA   ANESTHESIA:   general  EBL:  50 mL   BLOOD ADMINISTERED:none  DRAINS: none   LOCAL MEDICATIONS USED:  MARCAINE     SPECIMEN:  No Specimen  DISPOSITION OF SPECIMEN:  N/A  COUNTS:  YES  TOURNIQUET:  * No tourniquets in log *  DICTATION: .Dragon Dictation  PLAN OF CARE: Admit for overnight observation  PATIENT DISPOSITION:  PACU - hemodynamically stable.

## 2019-08-31 NOTE — Transfer of Care (Signed)
Immediate Anesthesia Transfer of Care Note  Patient: Calvin Hartman  Procedure(s) Performed: ANTERIOR CERVICAL DECOMPRESSION/DISCECTOMY FUSION CERVICAL FIVE- SEVEN (N/A Spine Cervical)  Patient Location: PACU  Anesthesia Type:General  Level of Consciousness: awake, oriented, patient cooperative and responds to stimulation  Airway & Oxygen Therapy: Patient Spontanous Breathing and Patient connected to face mask oxygen  Post-op Assessment: Report given to RN, Post -op Vital signs reviewed and stable and Patient moving all extremities  Post vital signs: Reviewed and stable  Last Vitals:  Vitals Value Taken Time  BP 153/90 08/31/19 1139  Temp    Pulse 107 08/31/19 1141  Resp 31 08/31/19 1140  SpO2 93 % 08/31/19 1141  Vitals shown include unvalidated device data.  Last Pain:  Vitals:   08/31/19 0717  TempSrc: Oral  PainSc: 4          Complications: No apparent anesthesia complications

## 2019-09-01 DIAGNOSIS — M5412 Radiculopathy, cervical region: Secondary | ICD-10-CM | POA: Diagnosis not present

## 2019-09-01 MED FILL — Thrombin (Recombinant) For Soln 20000 Unit: CUTANEOUS | Qty: 1 | Status: AC

## 2019-09-01 NOTE — Evaluation (Signed)
Physical Therapy Evaluation Patient Details Name: Calvin Hartman MRN: [id number] DOB: Jan 09, 1961 Today's Date: 09/01/2019   History of Present Illness  Pt is a 59 yo s/p ANTERIOR CERVICAL DECOMPRESSION/DISCECTOMY FUSION CERVICAL FIVE- SEVEN   Clinical Impression  Pt presented supine in bed with HOB elevated, awake and willing to participate in therapy session. Prior to admission, pt reported that he was independent with all functional mobility and ADLs. Pt lives with his wife in a single level home with several steps to enter. At the time of evaluation, pt moving very well and overall at a supervision to mod I level with all functional mobility including stair training. PT provided pt education re: cervical precautions with handout provided, car transfers with demonstration and a generalized walking program for pt to initiate upon d/c home. Pt expressed understanding. No further acute or f/u PT services indicated at this time. PT signing off.     Follow Up Recommendations No PT follow up    Equipment Recommendations  None recommended by PT    Recommendations for Other Services       Precautions / Restrictions Precautions Precautions: Cervical Precaution Booklet Issued: Yes (comment) Required Braces or Orthoses: Cervical Brace Cervical Brace: Hard collar Restrictions Weight Bearing Restrictions: No      Mobility  Bed Mobility Overal bed mobility: Modified Independent                Transfers Overall transfer level: Modified independent Equipment used: None                Ambulation/Gait Ambulation/Gait assistance: Modified independent (Device/Increase time) Gait Distance (Feet): 500 Feet Assistive device: None Gait Pattern/deviations: WFL(Within Functional Limits)        Stairs Stairs: Yes Stairs assistance: Supervision Stair Management: One rail Left;Alternating pattern;Forwards Number of Stairs: 10 General stair comments: no LOB or need for physical  assistance  Wheelchair Mobility    Modified Rankin (Stroke Patients Only)       Balance Overall balance assessment: No apparent balance deficits (not formally assessed)                                           Pertinent Vitals/Pain Pain Assessment: 0-10 Pain Score: 5  Pain Location: neck Pain Descriptors / Indicators: Aching Pain Intervention(s): Monitored during session;Repositioned    Home Living Family/patient expects to be discharged to:: Private residence Living Arrangements: Spouse/significant other;Children Available Help at Discharge: Family;Available 24 hours/day Type of Home: House Home Access: Stairs to enter   [employer] of Steps: 4 Home Layout: One level Home Equipment: Shower seat      Prior Function Level of Independence: Independent               Hand Dominance   Dominant Hand: Right    Extremity/Trunk Assessment   Upper Extremity Assessment Upper Extremity Assessment: Generalized weakness;Defer to OT evaluation    Lower Extremity Assessment Lower Extremity Assessment: Overall WFL for tasks assessed    Cervical / Trunk Assessment Cervical / Trunk Assessment: Other exceptions Cervical / Trunk Exceptions: s/p cervical sx  Communication   Communication: No difficulties  Cognition Arousal/Alertness: Awake/alert Behavior During Therapy: WFL for tasks assessed/performed Overall Cognitive Status: Within Functional Limits for tasks assessed  General Comments      Exercises     Assessment/Plan    PT Assessment Patent does not need any further PT services  PT Problem List         PT Treatment Interventions      PT Goals (Current goals can be found in the Care Plan section)  Acute Rehab PT Goals Patient Stated Goal: to go home PT Goal Formulation: All assessment and education complete, DC therapy    Frequency     Barriers to discharge         Co-evaluation               AM-PAC PT "6 Clicks" Mobility  Outcome Measure Help needed turning from your back to your side while in a flat bed without using bedrails?: None Help needed moving from lying on your back to sitting on the side of a flat bed without using bedrails?: None Help needed moving to and from a bed to a chair (including a wheelchair)?: None Help needed standing up from a chair using your arms (e.g., wheelchair or bedside chair)?: None Help needed to walk in hospital room?: None Help needed climbing 3-5 steps with a railing? : None 6 Click Score: 24    End of Session Equipment Utilized During Treatment: Cervical collar Activity Tolerance: Patient tolerated treatment well Patient left: in bed;with call bell/phone within reach Nurse Communication: Mobility status PT Visit Diagnosis: Other abnormalities of gait and mobility (R26.89)    Time: 1478-2956 PT Time Calculation (min) (ACUTE ONLY): 12 min   Charges:   PT Evaluation $PT Eval Low Complexity: 1 Low          Calvin Hartman, PT, DPT  Acute Rehabilitation Services Pager [phone] Office [phone]    Calvin Hartman 09/01/2019, 2:43 PM

## 2019-09-01 NOTE — Progress Notes (Signed)
Occupational Therapy Evaluation Patient Details Name: Calvin Hartman MRN: [id number] DOB: 05-Sep-1960 Today's Date: 09/01/2019    History of Present Illness 59 yo s/p ANTERIOR CERVICAL DECOMPRESSION/DISCECTOMY FUSION CERVICAL FIVE- SEVEN (   Clinical Impression   Completed education regarding compensatory strategies for ADL and functional mobility for ADL while adhering to cervical precautions. Wife present for education. No further OT needs.    Follow Up Recommendations  No OT follow up;Supervision - Intermittent    Equipment Recommendations  None recommended by OT    Recommendations for Other Services       Precautions / Restrictions Precautions Precautions: Cervical Precaution Booklet Issued: Yes (comment) Required Braces or Orthoses: Cervical Brace Cervical Brace: Hard collar(per orders may have off in bed adn ambulate to bathrom witho)      Mobility Bed Mobility Overal bed mobility: Modified Independent             General bed mobility comments: pt log rolling  Transfers Overall transfer level: Modified independent               General transfer comment: vc to lean lean over and pick up bag from floor    Balance                                           ADL either performed or assessed with clinical judgement   ADL Overall ADL's : Needs assistance/impaired                                     Functional mobility during ADLs: Modified independent(cues for cervical precautions during mobility) General ADL Comments: Educated pt on compensatory strategies regarding cervical precautions for ADL adn mobility for ADL. Educated wife on management of cervical brace. Wife able to return demonstrate donning/doffing brace. Written handout reviewed.      Vision         Perception     Praxis      Pertinent Vitals/Pain Pain Assessment: 0-10 Pain Score: 5  Pain Location: neck Pain Descriptors / Indicators: Aching Pain  Intervention(s): Limited activity within patient's tolerance     Hand Dominance Right   Extremity/Trunk Assessment Upper Extremity Assessment Upper Extremity Assessment: Generalized weakness(in shoulders)   Lower Extremity Assessment Lower Extremity Assessment: Defer to PT evaluation   Cervical / Trunk Assessment Cervical / Trunk Assessment: Other exceptions(cervical surgery)   Communication     Cognition Arousal/Alertness: Awake/alert Behavior During Therapy: WFL for tasks assessed/performed Overall Cognitive Status: Within Functional Limits for tasks assessed                                     General Comments       Exercises     Shoulder Instructions      Home Living Family/patient expects to be discharged to:: Private residence Living Arrangements: Spouse/significant other;Children Available Help at Discharge: Family;Available 24 hours/day Type of Home: House             Bathroom Shower/Tub: Walk-in Psychologist, prison and probation services: Standard Bathroom Accessibility: Yes How Accessible: Accessible via walker Home Equipment: Shower seat          Prior Functioning/Environment Level of Independence: Independent  OT Problem List: Decreased strength;Decreased knowledge of use of DME or AE;Decreased knowledge of precautions;Pain      OT Treatment/Interventions:      OT Goals(Current goals can be found in the care plan section) Acute Rehab OT Goals Patient Stated Goal: to go home OT Goal Formulation: All assessment and education complete, DC therapy  OT Frequency:     Barriers to D/C:            Co-evaluation              AM-PAC OT "6 Clicks" Daily Activity     Outcome Measure Help from another person eating meals?: None Help from another person taking care of personal grooming?: A Little Help from another person toileting, which includes using toliet, bedpan, or urinal?: A Little Help from another person  bathing (including washing, rinsing, drying)?: A Little Help from another person to put on and taking off regular upper body clothing?: A Little Help from another person to put on and taking off regular lower body clothing?: A Little 6 Click Score: 19   End of Session Equipment Utilized During Treatment: Cervical collar Nurse Communication: Other (comment)(DC needs)  Activity Tolerance: Patient tolerated treatment well Patient left: in bed;with call bell/phone within reach;with family/visitor present  OT Visit Diagnosis: Muscle weakness (generalized) (M62.81);Pain Pain - part of body: (neck)                Time: 8657-8469 OT Time Calculation (min): 15 min Charges:  OT General Charges $OT Visit: 1 Visit OT Evaluation $OT Eval Low Complexity: 1 Low  Heide Brossart, OT/L   Acute OT Clinical Specialist Acute Rehabilitation Services Pager [phone] Office [phone]   [facility] 09/01/2019, 9:09 AM

## 2019-09-01 NOTE — Progress Notes (Signed)
    Subjective: Procedure(s) (LRB): ANTERIOR CERVICAL DECOMPRESSION/DISCECTOMY FUSION CERVICAL FIVE- SEVEN (N/A) 1 Day Post-Op  Patient reports pain as 1 on 0-10 scale.  Reports none arm pain Reports minimal incisional neck pain   Positive void Negative bowel movement Positive flatus Negative chest pain or shortness of breath  Objective: Vital signs in last 24 hours: Temp:  [97.6 F (36.4 C)-98.4 F (36.9 C)] 98.4 F (36.9 C) (05/27 0352) Pulse Rate:  [66-102] 72 (05/27 0352) Resp:  [8-20] 18 (05/27 0352) BP: (119-165)/(67-93) 153/89 (05/27 0352) SpO2:  [90 %-100 %] 100 % (05/27 0352)  Intake/Output from previous day: 05/26 0701 - 05/27 0700 In: 2090 [P.O.:240; I.V.:1300; IV Piggyback:350] Out: 400 [Urine:350; Blood:50]  Labs: Recent Labs    08/29/19 0820  WBC 7.9  RBC 4.19*  HCT 40.0  PLT 238   Recent Labs    08/29/19 0820  NA 136  K 4.0  CL 104  CO2 25  BUN 19  CREATININE 0.82  GLUCOSE 125*  CALCIUM 8.9   Recent Labs    08/29/19 0820  INR 1.0    Physical Exam: Neurologically intact Intact pulses distally Incision: dressing C/D/I and no drainage Compartment soft There is no height or weight on file to calculate BMI.  Assessment/Plan: Patient stable  xrays n/a Mobilization with physical therapy Encourage incentive spirometry Continue care  Advance diet Up with therapy  Patient doing well Pain well controlled Plan on d/c this morning - follow up in 2 weeks  Venita Lick, MD [facility] [phone]

## 2019-09-01 NOTE — Plan of Care (Signed)
Pt and family given D/C instructions with RX's, verbal understanding was provided. Pt's incision is clean and dry with no sign of infection. Pt's IV was removed prior to D/C. Pt D/C'd home via wheelchair per MD order. Pt is stable @ D/C and has no other needs at this time. Rema Fendt, RN

## 2019-09-02 NOTE — Discharge Summary (Signed)
Patient ID: Calvin Hartman MRN: [id number] DOB/AGE: Mar 26, 1961 59 y.o.  Admit date: 08/31/2019 Discharge date: 09/02/2019  Admission Diagnoses:  Active Problems:   Cervical radiculopathy   Discharge Diagnoses:  Active Problems:   Cervical radiculopathy  status post Procedure(s): ANTERIOR CERVICAL DECOMPRESSION/DISCECTOMY FUSION CERVICAL FIVE- SEVEN  Past Medical History:  Diagnosis Date  . Anxiety and depression   . Arthritis   . Asthma   . Bronchitis, chronic (HCC)   . Chronic pain   . COPD (chronic obstructive pulmonary disease) (HCC)   . History of degenerative disc disease   . Opiate addiction (HCC)   . Schizophrenia (HCC)     Surgeries: Procedure(s): ANTERIOR CERVICAL DECOMPRESSION/DISCECTOMY FUSION CERVICAL FIVE- SEVEN on 08/31/2019   Consultants:   Discharged Condition: Improved  Hospital Course: Calvin Hartman is an 59 y.o. male who was admitted 08/31/2019 for operative treatment of Cervical spondylotic radiculopathy. Patient failed conservative treatments (please see the history and physical for the specifics) and had severe unremitting pain that affects sleep, daily activities and work/hobbies. After pre-op clearance, the patient was taken to the operating room on 08/31/2019 and underwent  Procedure(s): ANTERIOR CERVICAL DECOMPRESSION/DISCECTOMY FUSION CERVICAL FIVE- SEVEN.    Patient was given perioperative antibiotics:  Anti-infectives (From admission, onward)   Start     Dose/Rate Route Frequency Ordered Stop   08/31/19 1600  ceFAZolin (ANCEF) IVPB 2g/100 mL premix     2 g 200 mL/hr over 30 Minutes Intravenous Every 8 hours 08/31/19 1350 09/01/19 0016   08/31/19 0705  ceFAZolin (ANCEF) IVPB 2g/100 mL premix     2 g 200 mL/hr over 30 Minutes Intravenous 30 min pre-op 08/31/19 0705 08/31/19 0800       Patient was given sequential compression devices and early ambulation to prevent DVT.   Patient benefited maximally from hospital stay and there were no  complications. At the time of discharge, the patient was urinating/moving their bowels without difficulty, tolerating a regular diet, pain is controlled with oral pain medications and they have been cleared by PT/OT.   Recent vital signs: No data found.   Recent laboratory studies: No results for input(s): WBC, HGB, HCT, PLT, NA, K, CL, CO2, BUN, CREATININE, GLUCOSE, INR, CALCIUM in the last 72 hours.  Invalid input(s): PT, 2   Discharge Medications:   Allergies as of 09/01/2019      Reactions   Hydrocodone Nausea And Vomiting, Rash      Medication List    STOP taking these medications   GOODY HEADACHE PO     TAKE these medications   albuterol 108 (90 Base) MCG/ACT inhaler Commonly known as: VENTOLIN HFA Inhale 1-2 puffs into the lungs every 6 (six) hours as needed for wheezing or shortness of breath.   cephALEXin 500 MG capsule Commonly known as: KEFLEX Take 500 mg by mouth 2 (two) times daily.   methadone 10 MG/ML solution Commonly known as: DOLOPHINE Take 103 mg by mouth daily.   methocarbamol 500 MG tablet Commonly known as: Robaxin Take 1 tablet (500 mg total) by mouth every 8 (eight) hours as needed for up to 5 days for muscle spasms.   ondansetron 4 MG tablet Commonly known as: Zofran Take 1 tablet (4 mg total) by mouth every 8 (eight) hours as needed for up to 5 days for nausea or vomiting.   oxyCODONE-acetaminophen 10-325 MG tablet Commonly known as: Percocet Take 1 tablet by mouth every 6 (six) hours as needed for up to 5 days for pain. What changed:  when to take this   oxymetazoline 0.05 % nasal spray Commonly known as: AFRIN Place 1 spray into both nostrils 2 (two) times daily as needed for congestion.   rosuvastatin 5 MG tablet Commonly known as: CRESTOR Take 5 mg by mouth every other day. At bedtime       Diagnostic Studies: DG Chest 2 View  Result Date: 08/29/2019 CLINICAL DATA:  Preoperative EXAM: CHEST - 2 VIEW COMPARISON:  12/15/2016  FINDINGS: The heart size and mediastinal contours are within normal limits. Both lungs are clear. Metallic debris about the posterior left chest, unchanged. The visualized skeletal structures are unremarkable. IMPRESSION: No acute abnormality of the lungs. Electronically Signed   By: Eddie Candle M.D.   On: 08/29/2019 08:47   DG Cervical Spine 2-3 Views  Result Date: 08/31/2019 CLINICAL DATA:  Cervical spine fusion EXAM: CERVICAL SPINE - 2-3 VIEW; DG C-ARM 1-60 MIN COMPARISON:  06/11/2015 FLUOROSCOPY TIME:  0 minutes 49 seconds Dose: 4.29 mGy Images: 3 FINDINGS: Anterior plate and screws at G8-Z6 with intervening disc prostheses. Vertebral body heights maintained. Bones appear demineralized. IMPRESSION: Anterior fusion C5-C7. Electronically Signed   By: Lavonia Dana M.D.   On: 08/31/2019 11:26   DG C-Arm 1-60 Min  Result Date: 08/31/2019 CLINICAL DATA:  Cervical spine fusion EXAM: CERVICAL SPINE - 2-3 VIEW; DG C-ARM 1-60 MIN COMPARISON:  06/11/2015 FLUOROSCOPY TIME:  0 minutes 49 seconds Dose: 4.29 mGy Images: 3 FINDINGS: Anterior plate and screws at O2-H4 with intervening disc prostheses. Vertebral body heights maintained. Bones appear demineralized. IMPRESSION: Anterior fusion C5-C7. Electronically Signed   By: Lavonia Dana M.D.   On: 08/31/2019 11:26    Discharge Instructions    Incentive spirometry RT   Complete by: As directed       Follow-up Information    Melina Schools, MD. Schedule an appointment as soon as possible for a visit in 2 weeks.   Specialty: Orthopedic Surgery Why: If symptoms worsen, For suture removal, For wound re-check Contact information: [address] [place] [address] [phone]           Discharge Plan:  discharge to  home  Disposition: stable    Signed: Yvonne Kendall Sherril Heyward for [facility] PA-C [facility] [phone] 09/02/2019, 8:48 AM

## 2020-07-31 ENCOUNTER — Other Ambulatory Visit: Payer: Self-pay | Admitting: Orthopedic Surgery

## 2020-07-31 DIAGNOSIS — M542 Cervicalgia: Secondary | ICD-10-CM

## 2020-08-07 ENCOUNTER — Ambulatory Visit
Admission: RE | Admit: 2020-08-07 | Discharge: 2020-08-07 | Disposition: A | Discharge status: Home / Self Care | Payer: Medicaid Other | Source: Ambulatory Visit | Attending: Orthopedic Surgery | Admitting: Orthopedic Surgery

## 2020-08-07 DIAGNOSIS — M542 Cervicalgia: Secondary | ICD-10-CM

## 2021-09-02 DIAGNOSIS — I440 Atrioventricular block, first degree: Secondary | ICD-10-CM | POA: Diagnosis not present

## 2021-09-03 ENCOUNTER — Encounter: Payer: Self-pay | Admitting: Internal Medicine

## 2021-09-03 DIAGNOSIS — R079 Chest pain, unspecified: Secondary | ICD-10-CM | POA: Diagnosis not present

## 2021-09-08 IMAGING — CT CT CERVICAL SPINE W/O CM
4 of 5 series · 14 of 33 positions shown, 16 images · non-contrast
Comparison: None.

CLINICAL DATA: Neck pain, throat pain/constricting 6 months,
history of fusion

EXAM:
CT CERVICAL SPINE WITHOUT CONTRAST
TECHNIQUE: Multidetector CT imaging of the cervical spine was performed without
intravenous contrast. Multiplanar CT image reconstructions were also
generated.

[Series 4: c-spine 2.00 br44 s3 axial soft (person_name) · axial · 0.38mm/px · z∈[-763,-663]mm · 3 of 102 slices shown]
[im 26/102  soft-tissue]
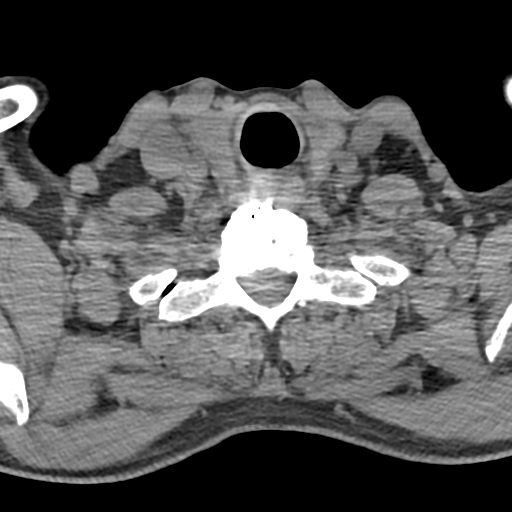
[im 51/102  soft-tissue]
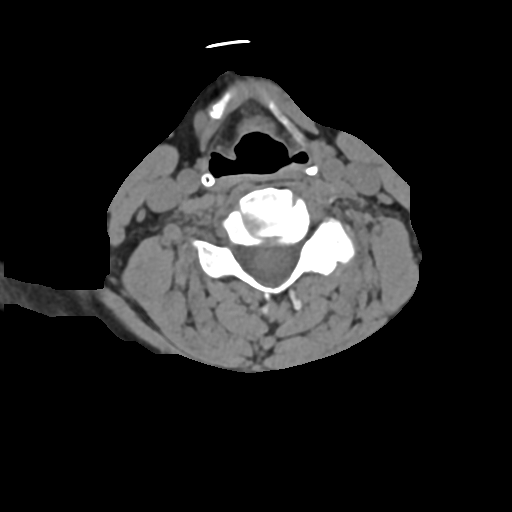
[im 76/102  soft-tissue]
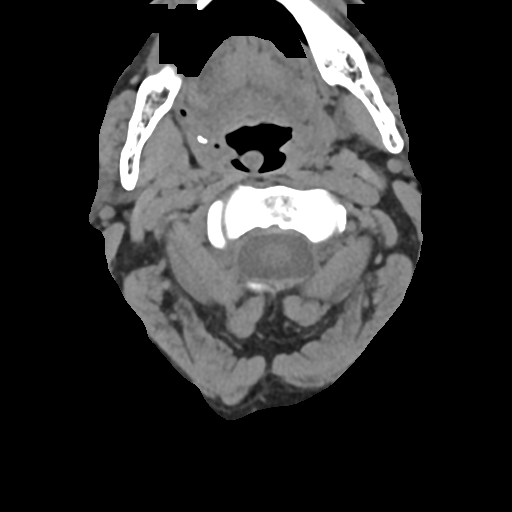

[Series 5: c-spine 2.00 br60 s3 sag bone · sagittal · 0.30mm/px · 5 of 74 slices shown, 6 images]
[im 25/74  bone]
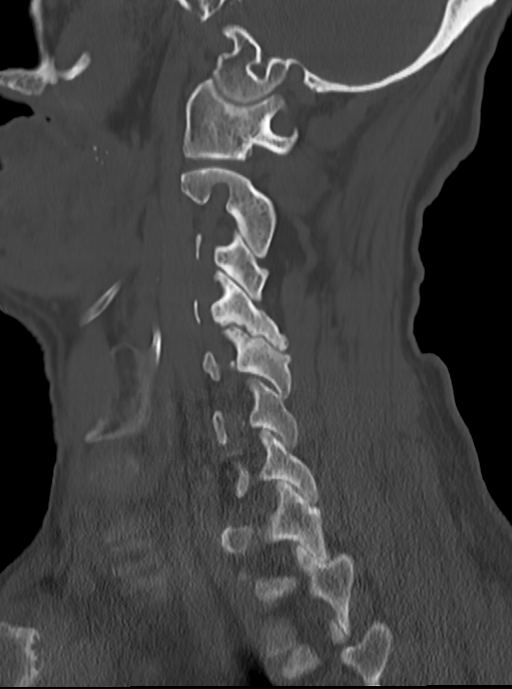
[im 31/74  bone]
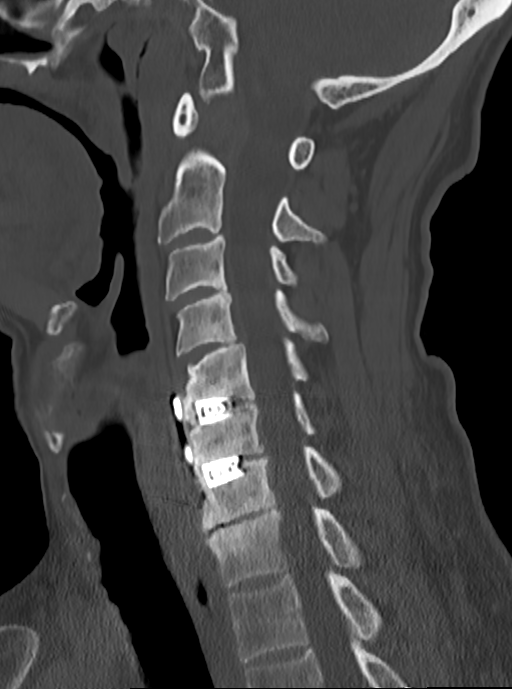
[im 37/74  soft-tissue]
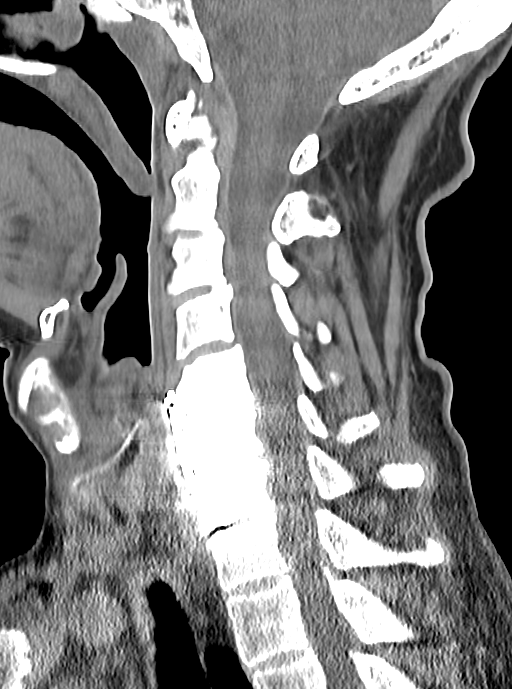
[im 37/74  bone]
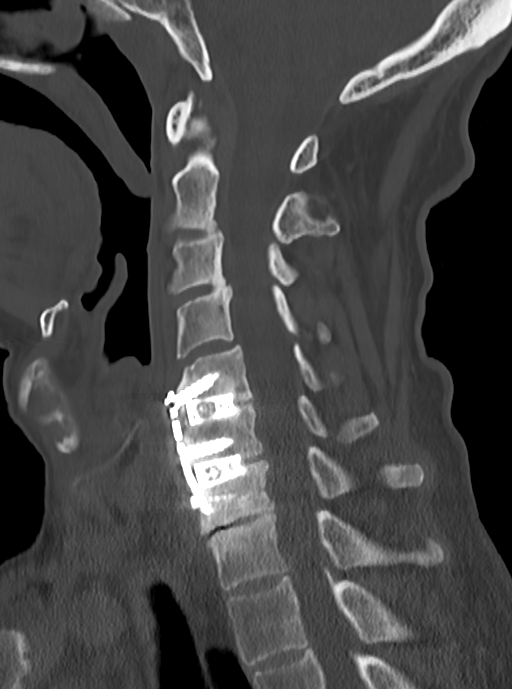
[im 43/74  bone]
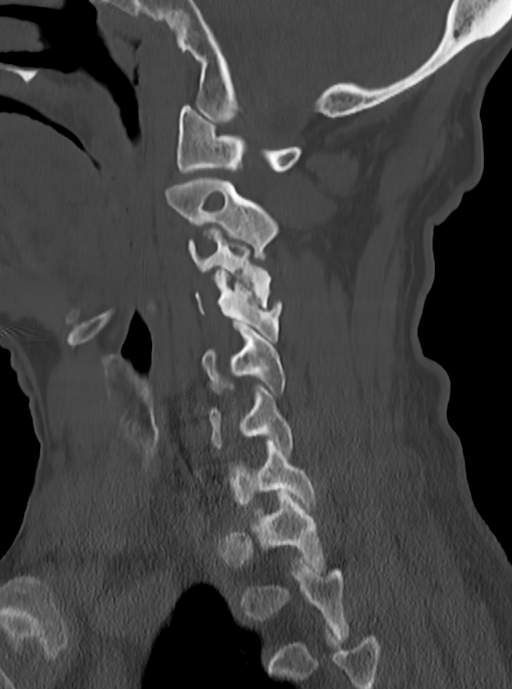
[im 49/74  bone]
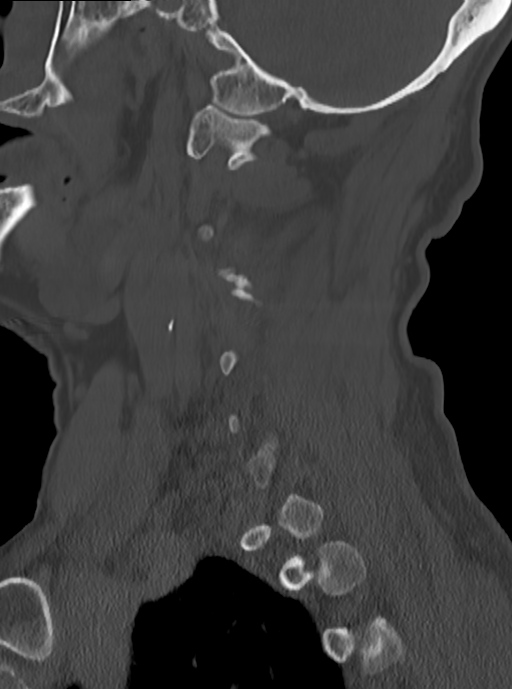

[Series 7: c-spine 2.00 hr60 s3 cor bone · coronal · 0.29mm/px · 3 of 76 slices shown]
[im 16/76  bone]
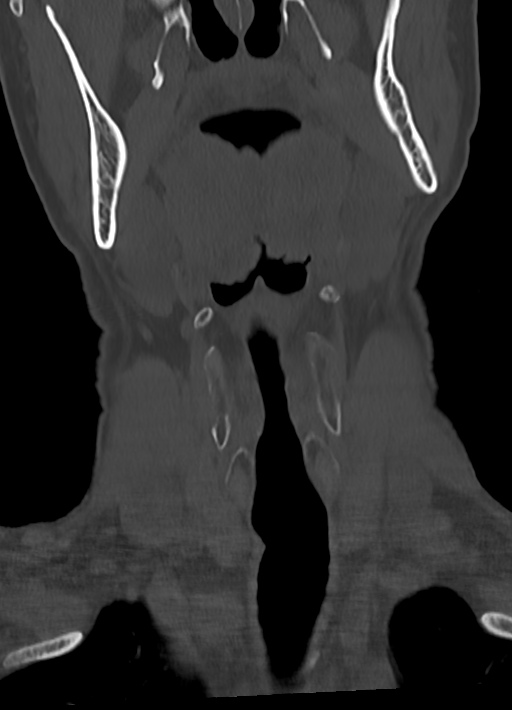
[im 31/76  bone]
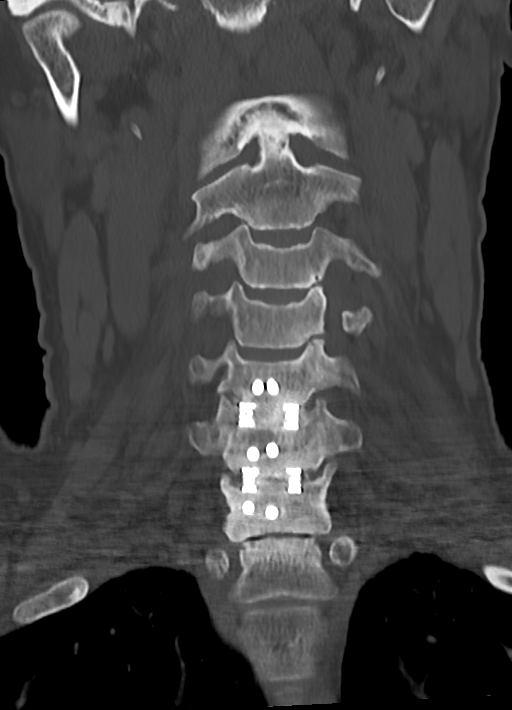
[im 46/76  bone]
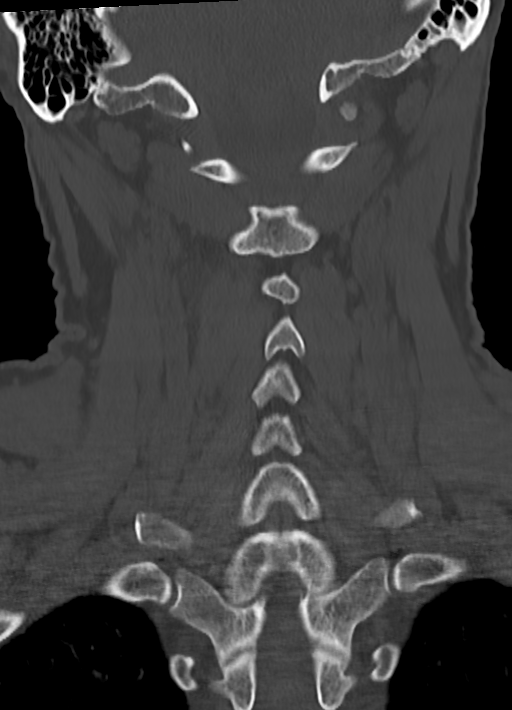

[Series 10: c-spine 2.00 hr60 s3 orthogonal axial bone · axial · 0.31mm/px · z∈[-786,-685]mm · 3 of 93 slices shown, 4 images]
[im 24/93  soft-tissue]
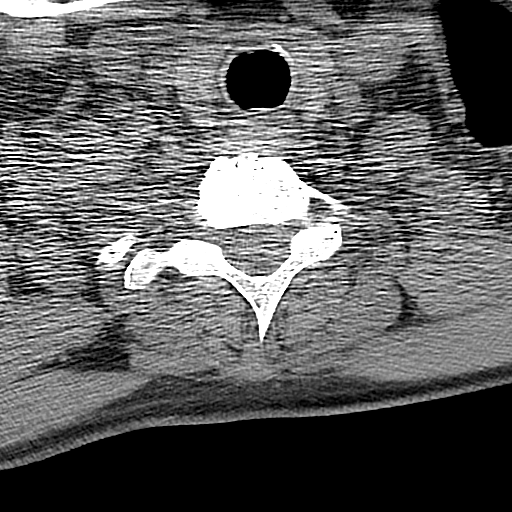
[im 24/93  bone]
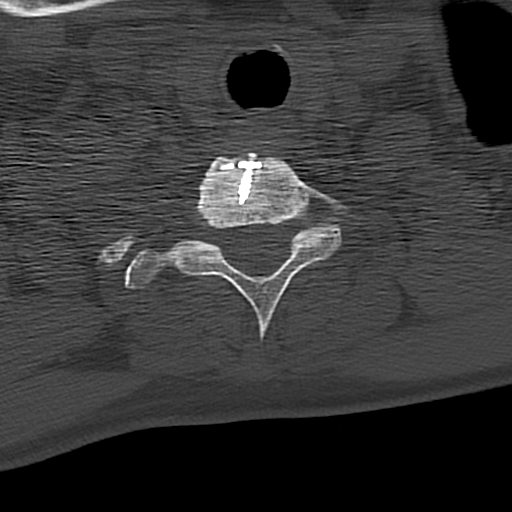
[im 47/93  bone]
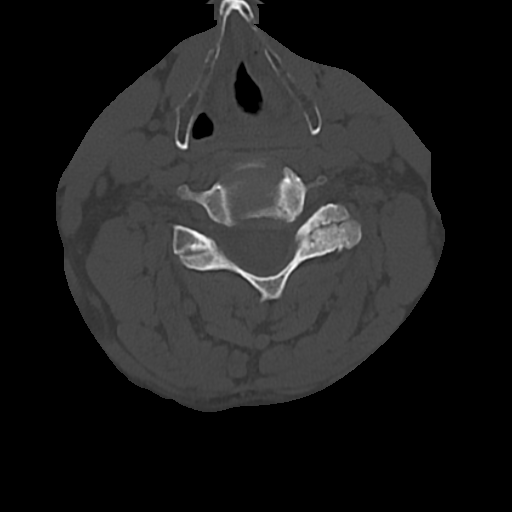
[im 70/93  bone]
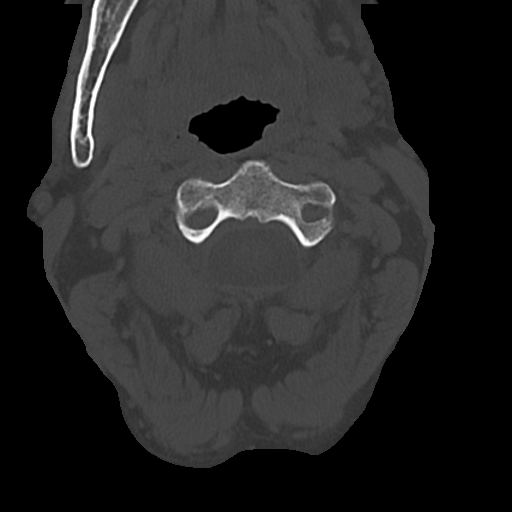

[14 of 33 positions shown; findings below may reference images not displayed]

FINDINGS: Alignment: Straightening of the cervical lordosis due to prior ACDF.
There is trace/grade 1 anterolisthesis.

Skull base and vertebrae: Prior ACDF from C5-C7. Intact hardware
without evidence of loosening. No acute fracture. No focal bone
lesion. Partially visualized mucosal thickening with frothy and
hyperdense material in the right maxillary sinus.

Soft tissues and spinal canal: No prevertebral fluid or swelling. No
visible canal hematoma.

Disc levels: There is severe disc height loss just below the fusion
at C7-T1 with small disc osteophyte complex, and mild disc height
loss at C3-C4 and C4-C5.There is severe left-sided facet arthritis
at C4-C5 and on the right at C3-C4. There is mild to moderate
left-sided neural foraminal narrowing at C4-C5 on the left and C3-C4
on the right. There is uncovertebral joint degenerative change at
these levels.

Upper chest: Paraseptal emphysema.

Other: None.
IMPRESSION: ACDF from C5-C7 without evidence of hardware complication.

Severe degenerative disc height loss below the fusion at C7-T1 with
small posterior disc osteophyte complex.

Mild to moderate neural foraminal narrowing at C4-C5 on the left and
on the right at C3-C4 due to severe facet arthritis and
uncovertebral joint degenerative change.

Degenerative grade 1 anterolisthesis at C4-C5.

Partially visualized mucosal thickening with frothy and hyperdense
material in the right maxillary sinus, correlate for symptoms of
sinusitis.
